# Patient Record
Sex: Female | Born: 1955 | Race: White | Hispanic: No | Marital: Married | Smoking: Never smoker
Health system: Southern US, Community
[De-identification: ages and names within clinical notes are randomized; demographics above are authoritative.]

## PROBLEM LIST (undated history)

## (undated) DIAGNOSIS — J45909 Unspecified asthma, uncomplicated: Secondary | ICD-10-CM

## (undated) DIAGNOSIS — T7840XA Allergy, unspecified, initial encounter: Secondary | ICD-10-CM

## (undated) HISTORY — PX: TONSILLECTOMY: SUR1361

## (undated) HISTORY — DX: Unspecified asthma, uncomplicated: J45.909

## (undated) HISTORY — DX: Allergy, unspecified, initial encounter: T78.40XA

---

## 1998-05-16 ENCOUNTER — Encounter: Admission: RE | Admit: 1998-05-16 | Discharge: 1998-05-16 | Payer: Self-pay | Admitting: Family Medicine

## 1998-05-20 ENCOUNTER — Encounter: Admission: RE | Admit: 1998-05-20 | Discharge: 1998-05-20 | Payer: Self-pay | Admitting: Sports Medicine

## 1998-08-09 ENCOUNTER — Ambulatory Visit (HOSPITAL_COMMUNITY): Admission: RE | Admit: 1998-08-09 | Discharge: 1998-08-09 | Payer: Self-pay | Admitting: Obstetrics and Gynecology

## 1998-08-19 ENCOUNTER — Encounter: Admission: RE | Admit: 1998-08-19 | Discharge: 1998-08-19 | Payer: Self-pay | Admitting: Sports Medicine

## 1998-08-23 ENCOUNTER — Encounter: Admission: RE | Admit: 1998-08-23 | Discharge: 1998-08-23 | Payer: Self-pay | Admitting: Sports Medicine

## 1999-04-20 ENCOUNTER — Encounter: Admission: RE | Admit: 1999-04-20 | Discharge: 1999-04-20 | Payer: Self-pay | Admitting: Sports Medicine

## 1999-09-19 ENCOUNTER — Other Ambulatory Visit: Admission: RE | Admit: 1999-09-19 | Discharge: 1999-09-19 | Payer: Self-pay | Admitting: Obstetrics and Gynecology

## 1999-11-07 ENCOUNTER — Encounter: Payer: Self-pay | Admitting: Obstetrics and Gynecology

## 1999-11-07 ENCOUNTER — Ambulatory Visit (HOSPITAL_COMMUNITY): Admission: RE | Admit: 1999-11-07 | Discharge: 1999-11-07 | Payer: Self-pay | Admitting: Obstetrics and Gynecology

## 2000-05-17 ENCOUNTER — Encounter: Admission: RE | Admit: 2000-05-17 | Discharge: 2000-05-17 | Payer: Self-pay | Admitting: Family Medicine

## 2000-09-27 ENCOUNTER — Other Ambulatory Visit: Admission: RE | Admit: 2000-09-27 | Discharge: 2000-09-27 | Payer: Self-pay | Admitting: Obstetrics and Gynecology

## 2000-10-16 ENCOUNTER — Ambulatory Visit (HOSPITAL_COMMUNITY): Admission: RE | Admit: 2000-10-16 | Discharge: 2000-10-16 | Payer: Self-pay | Admitting: Obstetrics and Gynecology

## 2000-10-16 ENCOUNTER — Encounter: Payer: Self-pay | Admitting: Obstetrics and Gynecology

## 2000-10-22 ENCOUNTER — Encounter: Payer: Self-pay | Admitting: Obstetrics and Gynecology

## 2000-10-22 ENCOUNTER — Encounter: Admission: RE | Admit: 2000-10-22 | Discharge: 2000-10-22 | Payer: Self-pay | Admitting: Obstetrics and Gynecology

## 2000-10-31 ENCOUNTER — Encounter: Admission: RE | Admit: 2000-10-31 | Discharge: 2000-10-31 | Payer: Self-pay | Admitting: Family Medicine

## 2000-11-04 ENCOUNTER — Encounter: Admission: RE | Admit: 2000-11-04 | Discharge: 2000-11-04 | Payer: Self-pay | Admitting: Family Medicine

## 2000-11-11 ENCOUNTER — Encounter: Admission: RE | Admit: 2000-11-11 | Discharge: 2000-11-11 | Payer: Self-pay | Admitting: Sports Medicine

## 2001-11-07 ENCOUNTER — Encounter: Payer: Self-pay | Admitting: Obstetrics and Gynecology

## 2001-11-07 ENCOUNTER — Ambulatory Visit (HOSPITAL_COMMUNITY): Admission: RE | Admit: 2001-11-07 | Discharge: 2001-11-07 | Payer: Self-pay | Admitting: Obstetrics and Gynecology

## 2002-03-17 ENCOUNTER — Other Ambulatory Visit: Admission: RE | Admit: 2002-03-17 | Discharge: 2002-03-17 | Payer: Self-pay | Admitting: Obstetrics and Gynecology

## 2002-05-07 ENCOUNTER — Encounter: Admission: RE | Admit: 2002-05-07 | Discharge: 2002-05-07 | Payer: Self-pay | Admitting: Sports Medicine

## 2002-11-13 ENCOUNTER — Ambulatory Visit (HOSPITAL_COMMUNITY): Admission: RE | Admit: 2002-11-13 | Discharge: 2002-11-13 | Payer: Self-pay | Admitting: Obstetrics and Gynecology

## 2002-11-13 ENCOUNTER — Encounter: Payer: Self-pay | Admitting: Obstetrics and Gynecology

## 2003-05-24 ENCOUNTER — Other Ambulatory Visit: Admission: RE | Admit: 2003-05-24 | Discharge: 2003-05-24 | Payer: Self-pay | Admitting: Obstetrics and Gynecology

## 2003-05-27 ENCOUNTER — Encounter: Admission: RE | Admit: 2003-05-27 | Discharge: 2003-05-27 | Payer: Self-pay | Admitting: Sports Medicine

## 2004-02-21 ENCOUNTER — Encounter: Admission: RE | Admit: 2004-02-21 | Discharge: 2004-02-21 | Payer: Self-pay | Admitting: Family Medicine

## 2004-04-14 ENCOUNTER — Ambulatory Visit (HOSPITAL_COMMUNITY): Admission: RE | Admit: 2004-04-14 | Discharge: 2004-04-14 | Payer: Self-pay | Admitting: Obstetrics and Gynecology

## 2004-05-11 ENCOUNTER — Encounter: Admission: RE | Admit: 2004-05-11 | Discharge: 2004-05-11 | Payer: Self-pay | Admitting: Sports Medicine

## 2005-03-14 ENCOUNTER — Ambulatory Visit: Payer: Self-pay | Admitting: Family Medicine

## 2005-05-03 ENCOUNTER — Ambulatory Visit (HOSPITAL_COMMUNITY): Admission: RE | Admit: 2005-05-03 | Discharge: 2005-05-03 | Payer: Self-pay | Admitting: Obstetrics and Gynecology

## 2005-06-28 ENCOUNTER — Ambulatory Visit: Payer: Self-pay | Admitting: Sports Medicine

## 2006-05-23 ENCOUNTER — Ambulatory Visit (HOSPITAL_COMMUNITY): Admission: RE | Admit: 2006-05-23 | Discharge: 2006-05-23 | Payer: Self-pay | Admitting: Obstetrics and Gynecology

## 2006-11-28 ENCOUNTER — Ambulatory Visit: Payer: Self-pay | Admitting: Sports Medicine

## 2007-02-27 DIAGNOSIS — J45909 Unspecified asthma, uncomplicated: Secondary | ICD-10-CM | POA: Insufficient documentation

## 2007-02-27 DIAGNOSIS — K589 Irritable bowel syndrome without diarrhea: Secondary | ICD-10-CM | POA: Insufficient documentation

## 2007-10-29 ENCOUNTER — Ambulatory Visit: Payer: Self-pay | Admitting: Family Medicine

## 2008-07-22 ENCOUNTER — Ambulatory Visit (HOSPITAL_COMMUNITY): Admission: RE | Admit: 2008-07-22 | Discharge: 2008-07-22 | Payer: Self-pay | Admitting: Obstetrics and Gynecology

## 2008-08-04 ENCOUNTER — Encounter: Admission: RE | Admit: 2008-08-04 | Discharge: 2008-08-04 | Payer: Self-pay | Admitting: Obstetrics and Gynecology

## 2009-01-31 ENCOUNTER — Encounter: Admission: RE | Admit: 2009-01-31 | Discharge: 2009-01-31 | Payer: Self-pay | Admitting: Obstetrics and Gynecology

## 2009-02-04 ENCOUNTER — Encounter (INDEPENDENT_AMBULATORY_CARE_PROVIDER_SITE_OTHER): Payer: Self-pay | Admitting: Obstetrics and Gynecology

## 2009-02-04 ENCOUNTER — Encounter: Admission: RE | Admit: 2009-02-04 | Discharge: 2009-02-04 | Payer: Self-pay | Admitting: Obstetrics and Gynecology

## 2009-07-08 ENCOUNTER — Emergency Department (HOSPITAL_COMMUNITY): Admission: EM | Admit: 2009-07-08 | Discharge: 2009-07-08 | Payer: Self-pay | Admitting: Emergency Medicine

## 2009-11-15 ENCOUNTER — Encounter: Payer: Self-pay | Admitting: Sports Medicine

## 2009-11-15 ENCOUNTER — Ambulatory Visit: Payer: Self-pay | Admitting: Sports Medicine

## 2009-11-15 LAB — CONVERTED CEMR LAB
AST: 17 units/L (ref 0–37)
Albumin: 4.3 g/dL (ref 3.5–5.2)
Calcium: 8.7 mg/dL (ref 8.4–10.5)
HCT: 44.8 % (ref 36.0–46.0)
HDL: 62 mg/dL (ref >39)
Hemoglobin: 14.5 g/dL (ref 12.0–15.0)
LDL Cholesterol: 63 mg/dL (ref 0–99)
RBC: 4.56 M/uL (ref 3.87–5.11)
RDW: 13 % (ref 11.5–15.5)
Total Bilirubin: 1.1 mg/dL (ref 0.3–1.2)
Total Protein: 6.6 g/dL (ref 6.0–8.3)
Triglycerides: 67 mg/dL (ref <150)

## 2009-11-16 ENCOUNTER — Encounter (INDEPENDENT_AMBULATORY_CARE_PROVIDER_SITE_OTHER): Payer: Self-pay | Admitting: *Deleted

## 2009-11-16 ENCOUNTER — Ambulatory Visit: Payer: Self-pay | Admitting: Sports Medicine

## 2009-11-16 DIAGNOSIS — M79609 Pain in unspecified limb: Secondary | ICD-10-CM | POA: Insufficient documentation

## 2010-02-22 ENCOUNTER — Encounter: Admission: RE | Admit: 2010-02-22 | Discharge: 2010-02-22 | Payer: Self-pay | Admitting: Obstetrics and Gynecology

## 2010-11-09 ENCOUNTER — Encounter (INDEPENDENT_AMBULATORY_CARE_PROVIDER_SITE_OTHER): Payer: Self-pay | Admitting: *Deleted

## 2011-01-22 ENCOUNTER — Encounter: Payer: Self-pay | Admitting: Obstetrics and Gynecology

## 2011-01-30 NOTE — Miscellaneous (Signed)
Summary: Ventolin refill   Clinical Lists Changes  Medications: Rx of VENTOLIN HFA 108 (90 BASE) MCG/ACT AERS (ALBUTEROL SULFATE) take ii puffs q4hrs;  #1 x 0;  Signed;  Entered by: Rochele Pages RN;  Authorized by: Enid Baas MD;  Method used: Electronically to [facility] [address] [place], [place]  [id number], Ph: [phone], Fax: [phone]    Prescriptions: VENTOLIN HFA 108 (90 BASE) MCG/ACT AERS (ALBUTEROL SULFATE) take ii puffs q4hrs  #1 x 0   Entered by:   Rochele Pages RN   Authorized by:   Enid Baas MD   Signed by:   Rochele Pages RN on 11/09/2010   Method used:   Electronically to        [facility] (retail)       [address]       [place], [place]  [id number]       Ph: [phone]       Fax: [phone]   RxID:   [id number]  Called patient, advised to make sure she schedules appt within the next month because it has been almost 1 year since she has been seen. Rochele Pages RN  November 09, 2010 12:34 PM

## 2011-02-23 ENCOUNTER — Other Ambulatory Visit (HOSPITAL_COMMUNITY): Payer: Self-pay | Admitting: Obstetrics and Gynecology

## 2011-02-23 DIAGNOSIS — Z1231 Encounter for screening mammogram for malignant neoplasm of breast: Secondary | ICD-10-CM

## 2011-03-05 ENCOUNTER — Ambulatory Visit (HOSPITAL_COMMUNITY)
Admission: RE | Admit: 2011-03-05 | Discharge: 2011-03-05 | Disposition: A | Discharge status: Home / Self Care | Payer: 59 | Source: Ambulatory Visit | Attending: Obstetrics and Gynecology | Admitting: Obstetrics and Gynecology

## 2011-03-05 ENCOUNTER — Ambulatory Visit (INDEPENDENT_AMBULATORY_CARE_PROVIDER_SITE_OTHER): Payer: 59 | Admitting: Sports Medicine

## 2011-03-05 ENCOUNTER — Encounter: Payer: Self-pay | Admitting: Sports Medicine

## 2011-03-05 DIAGNOSIS — M25559 Pain in unspecified hip: Secondary | ICD-10-CM

## 2011-03-05 DIAGNOSIS — M6281 Muscle weakness (generalized): Secondary | ICD-10-CM | POA: Insufficient documentation

## 2011-03-05 DIAGNOSIS — Z1231 Encounter for screening mammogram for malignant neoplasm of breast: Secondary | ICD-10-CM

## 2011-03-13 NOTE — Assessment & Plan Note (Signed)
Summary: LEFT HIP [facility] [phone]   Vital Signs:  Patient profile:   56 year old female Height:      66 inches Weight:      130 pounds BMI:     21.06 Pulse rate:   62 / minute BP sitting:   115 / 78  (right arm)  Vitals Entered By: Rochele Pages RN (March 05, 2011 10:05 AM) CC: Left low back pain, L side ant hip pain   CC:  Left low back pain and L side ant hip pain.  History of Present Illness: [Note by: Kerman Passey MSIV]  Katelyn Thomas presents today with a CC of L hip pain and L lower back pain.  Patient states that her hip has been problematic for about 3 weeks.  Previously, her hip only gave her pain/discomfort after steep hikes. However over the past 3 weeks her routine workout has caused aching so severe that she has lifts her left leg with her hands to abduct her hip after her exercise.  She is currently not taking any medication to help with the pain.  She states that her left hip feels extremely tight and so stretching has helped provide some relief. Patient denies point tenderness in the region of her greater trochanter.  She denies any previous trauma to the L hip.  However she states that her left hip has always felt tighter than her right hip. The aching has caused her to wake up from sleep at night.  Stretching helps alleviate her discomfort.  Preventive Screening-Counseling & Management  Alcohol-Tobacco     Smoking Status: never  Problems Prior to Update: 1)  Muscle Weakness (GENERALIZED)  (ICD-728.87) 2)  Foot Pain  (ICD-729.5) 3)  Foot Pain, Right  (ICD-729.5) 4)  Preventive Health Care  (ICD-V70.0) 5)  Health Screening  (ICD-V70.0) 6)  Irritable Bowel Syndrome  (ICD-564.1) 7)  Asthma, Unspecified  (ICD-493.90)  Medications Prior to Update: 1)  Ventolin Hfa 108 (90 Base) Mcg/act Aers (Albuterol Sulfate) .... 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 2 sisters 58and 68 both A&W  father 37 A&W,   mother DJD at age 50 now has AODM although she is slim some bipolar issues in late 60s/ slight memory loss  Social History: Reviewed history from 11/15/2009 and no changes required. 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         per HPI  Physical Exam  General:  alert and healthy-appearing.   Msk:  Hip: ROM IR full and only slt  decreased ER  of (L) hip,  Abduction: 45 Deg (B)  No TTP at great troch/  slt ttp just behind this at tend insertion of glut med  Strength Flexion: 5/5 (B), Extension: 5/5 (B), Abduction: 5/5 (R) 4/5 (L) Pelvic alignment unremarkable to inspection and palpation. No tenderness to palpation of greater trochanter (L) SI joint tenderness & SI joint mouse Hip rock negative to pain, decreased motion of the left hip. FABER caused no discomfort. However, left side showed marked decrease in flexibility.    Hip Exam  Hip Exam:    Right:    Inspection:  Normal    Palpation:  Normal    Stability:  stable    Left:    Inspection:  Normal    Palpation:  Normal    Stability:  stable     Tenderness:  no    Swelling:  no    Impression & Recommendations:  Problem # 1:  HIP PAIN, LEFT (ICD-719.45) Gluteus medius weakness - patient was instructed to start strengthening exercises for her gluteus medius. - patient advised she may continue her work out on the stair cljmber, but should keep the resistance low. - after 6 weeks patient should retrun for f/u appt. If no improvment, may perform [place] scan of (L) hip and try more aggressive treatment methods.  Complete Medication List: 1)  Ventolin Hfa 108 (90 Base) Mcg/act Aers (Albuterol sulfate) .... Take ii puffs q4hrs  Patient Instructions: 1)  Instructed to start home exercise program to help strengthen her gluteus medius. Directions included the following exercises/stretches: 2)  pretzel stretch 3)  cross over step up 4)  hip abduction 5)  standing hip rotation 6)  Advised that to continue her workout routine on the stair climber but should keep her exercise at a low resistance. 7)  Instructed to follow-up after 6 weeks. If no change noted at that time, may consider scanning gluteus medius and more aggressive means of therapy for treatment. Prescriptions: VENTOLIN HFA 108 (90 BASE) MCG/ACT AERS (ALBUTEROL SULFATE) take ii puffs q4hrs  #1 x 12   Entered by:   Lillia Pauls CMA   Authorized by:   Enid Baas MD   Signed by:   Lillia Pauls CMA on 03/05/2011   Method used:   Electronically to        [facility] (retail)       [address]       [place], [place]  [id number]       Ph: [phone]       Fax: [phone]   RxID:   [id number]    Orders Added: 1)  Est. Patient Level III [[address]75]

## 2011-04-27 ENCOUNTER — Encounter: Payer: Self-pay | Admitting: Sports Medicine

## 2011-04-27 ENCOUNTER — Ambulatory Visit (INDEPENDENT_AMBULATORY_CARE_PROVIDER_SITE_OTHER): Payer: 59 | Admitting: Sports Medicine

## 2011-04-27 VITALS — BP 109/70 | HR 62

## 2011-04-27 DIAGNOSIS — M25559 Pain in unspecified hip: Secondary | ICD-10-CM

## 2011-04-27 DIAGNOSIS — M533 Sacrococcygeal disorders, not elsewhere classified: Secondary | ICD-10-CM | POA: Insufficient documentation

## 2011-04-27 DIAGNOSIS — M999 Biomechanical lesion, unspecified: Secondary | ICD-10-CM

## 2011-04-27 NOTE — Progress Notes (Deleted)
  Subjective:    Patient ID: Katelyn Thomas, female    DOB: [birth year], 56 y.o.   MRN: [id number]  HPI    Review of Systems     Objective:   Physical Exam        Assessment & Plan:

## 2011-04-27 NOTE — Assessment & Plan Note (Signed)
I suggested that she see the physical therapist for evaluation and exercises to help [place] in the pain over both SI joints.  Referred to Sharen Hones  I will follow up with her after she has had at least 8 sessions.

## 2011-04-27 NOTE — Progress Notes (Signed)
  Subjective:    Patient ID: Katelyn Thomas, female    DOB: November 23, 1955, 56 y.o.   MRN: [id number]  HPI  Pt presents to clinic for f/u of L hip pain and lower back pain.   L hip pain is much improved.  Discomfort is infrequent and less intense.  Able to sleep on lt side now.   Did suggested hip exercises 5 days per week. Lt low back pain continues- cycling aggravates pain the most- and aching will remain for the next 1-2 days.  Does not take any medication for pain.  Has decreased mileage and speed due to pain.  Cycles about 3 times per week. Has incorporated some back stretches and strengthening work 5 times per week.     Review of Systems     Objective:   Physical Exam     Tenderness over SI joint nodule on lt IR 25 deg, ER 45 deg on rt IR 25 deg, ER 35 on lt  FABER limited on lt Sartorius strong bilat Quads strong bilat Good hip flexion strength bilat straight position and externally rotated  SI joints move Good hip abduction and ER strength bilat Hip extension good bilat Good HS strength bilat Nodules on rt and lt SI joints, Lt is more prominent when lying on stomach, but more posterior when lying on back  MSK ultrasound Skin does reveal a soft tissue nodule just about the left PSIS that is freely mobile. This is 1.5 x 1.4 x 1 cm in greatest dimensions. There is no tendinous tear nor any abnormal Doppler flow is noted. Just above the right PSIS there is also a small nodule that is 1.2 x 0.8 x 0.4 cm in greatest dimensions. There is no tear of the tendons nor is there any abnormal Doppler flow.       Assessment & Plan:

## 2011-04-27 NOTE — Assessment & Plan Note (Signed)
This is much improved after completing her exercise program and does not seem to be limiting her from exercise with most of her symptoms now referring to the low back.

## 2011-05-15 ENCOUNTER — Ambulatory Visit (INDEPENDENT_AMBULATORY_CARE_PROVIDER_SITE_OTHER): Payer: 59 | Admitting: Sports Medicine

## 2011-05-15 ENCOUNTER — Encounter: Payer: Self-pay | Admitting: Sports Medicine

## 2011-05-15 VITALS — BP 101/67

## 2011-05-15 DIAGNOSIS — M533 Sacrococcygeal disorders, not elsewhere classified: Secondary | ICD-10-CM

## 2011-05-15 DIAGNOSIS — M999 Biomechanical lesion, unspecified: Secondary | ICD-10-CM

## 2011-05-15 NOTE — Progress Notes (Signed)
  Subjective:    Patient ID: Katelyn Thomas, female    DOB: 26-Aug-1955, 56 y.o.   MRN: [id number]  HPI  Pt continues with pain over left SI joint, considering injection today.  The hip pain localizes directly to the SI joint. She gets some brief relief with soft tissue work. She gets worsening with too much biking. She does plan to go to physical therapy but would like to try the injection to see if she can lessen her pain prior to starting the therapy.   Review of Systems     Objective:   Physical Exam     Lt SI joint anteriorly rotated, does not move as freely as right FABER normal on rt, tight on lt Large SI joint nodule on left approx 1 cm, smaller nodule on rt Left SI joint moves but is somewhat limited with external rotation of the left hip. Hip joint shows normal range of motion and she has good strength of the muscles around the hip girdle.    Assessment & Plan:

## 2011-05-15 NOTE — Assessment & Plan Note (Signed)
Consent obtained and verified. Sterile prep- cleansed with alcohol. Topical analgesic spray: Ethyl chloride. Joint: Approached in typical fashion with: lateral rotation of left hip the injection was directed into superior aspect of SI joint on left Completed without difficulty Meds: kenalog 40 + 4 cc of lidocaine 1% Needle:25 G and 1.5 inch Aftercare instructions and Red flags advised.  After injection she should keep up some easy rotation exercises for the hip and SI joint. She should use icing for pain relief. Once she is done about 6 weeks of physical therapy I would like to reevaluate her.

## 2011-05-17 ENCOUNTER — Ambulatory Visit: Payer: 59 | Admitting: Sports Medicine

## 2012-03-26 ENCOUNTER — Other Ambulatory Visit: Payer: Self-pay | Admitting: *Deleted

## 2012-03-26 MED ORDER — ALBUTEROL SULFATE HFA 108 (90 BASE) MCG/ACT IN AERS: 2.0000 | INHALATION_SPRAY | RESPIRATORY_TRACT | Status: DC | PRN

## 2012-04-07 ENCOUNTER — Other Ambulatory Visit (HOSPITAL_COMMUNITY): Payer: Self-pay | Admitting: Obstetrics and Gynecology

## 2012-04-07 DIAGNOSIS — Z1231 Encounter for screening mammogram for malignant neoplasm of breast: Secondary | ICD-10-CM

## 2012-05-06 ENCOUNTER — Ambulatory Visit (HOSPITAL_COMMUNITY)
Admission: RE | Admit: 2012-05-06 | Discharge: 2012-05-06 | Disposition: A | Discharge status: Home / Self Care | Payer: 59 | Source: Ambulatory Visit | Attending: Obstetrics and Gynecology | Admitting: Obstetrics and Gynecology

## 2012-05-06 DIAGNOSIS — Z1231 Encounter for screening mammogram for malignant neoplasm of breast: Secondary | ICD-10-CM | POA: Insufficient documentation

## 2012-12-29 ENCOUNTER — Ambulatory Visit (INDEPENDENT_AMBULATORY_CARE_PROVIDER_SITE_OTHER): Payer: 59 | Admitting: Family Medicine

## 2012-12-29 VITALS — BP 109/71 | HR 67 | Temp 98.3°F | Resp 16 | Ht 66.0 in | Wt 127.0 lb

## 2012-12-29 DIAGNOSIS — H6690 Otitis media, unspecified, unspecified ear: Secondary | ICD-10-CM

## 2012-12-29 DIAGNOSIS — J329 Chronic sinusitis, unspecified: Secondary | ICD-10-CM

## 2012-12-29 MED ORDER — FLUTICASONE PROPIONATE 50 MCG/ACT NA SUSP: 2.0000 | Freq: Every day | NASAL | Status: DC

## 2012-12-29 MED ORDER — TRAMADOL HCL 50 MG PO TABS: 50.0000 mg | ORAL_TABLET | Freq: Three times a day (TID) | ORAL | Status: DC | PRN

## 2012-12-29 MED ORDER — CEFDINIR 300 MG PO CAPS: 300.0000 mg | ORAL_CAPSULE | Freq: Two times a day (BID) | ORAL | Status: DC

## 2012-12-29 NOTE — Progress Notes (Signed)
Subjective: Patient has had an upper sparked or infection for several days. Thinks her sinuses are infected. The last few days she's had increasing pain in her right ear. She has a history of a lot of otitis infections when she was young.  Objective:  Right ear red and bulging. Left TM normal. Throat mildly erythematous more on the right. Neck neck supple without significant nodes. She's mildly tender over sinuses. Chest is clear to auscultation.  Assessment: Right otitis media Sinusitis  Plan: Flonase Omnicef 300 twice a day (has taken cephalosporin successfully ) Comment off for pain  Return if worse.

## 2012-12-29 NOTE — Patient Instructions (Signed)
Take medications as directed. Return if worse.

## 2013-06-24 ENCOUNTER — Other Ambulatory Visit (HOSPITAL_COMMUNITY): Payer: Self-pay | Admitting: Obstetrics and Gynecology

## 2013-06-24 DIAGNOSIS — Z1231 Encounter for screening mammogram for malignant neoplasm of breast: Secondary | ICD-10-CM

## 2013-07-01 ENCOUNTER — Ambulatory Visit (HOSPITAL_COMMUNITY)
Admission: RE | Admit: 2013-07-01 | Discharge: 2013-07-01 | Disposition: A | Discharge status: Home / Self Care | Payer: 59 | Source: Ambulatory Visit | Attending: Obstetrics and Gynecology | Admitting: Obstetrics and Gynecology

## 2013-07-01 DIAGNOSIS — Z1231 Encounter for screening mammogram for malignant neoplasm of breast: Secondary | ICD-10-CM | POA: Insufficient documentation

## 2014-01-28 ENCOUNTER — Ambulatory Visit (INDEPENDENT_AMBULATORY_CARE_PROVIDER_SITE_OTHER): Payer: 59 | Admitting: Emergency Medicine

## 2014-01-28 ENCOUNTER — Encounter: Payer: Self-pay | Admitting: Emergency Medicine

## 2014-01-28 VITALS — BP 112/75 | HR 57 | Ht 66.0 in | Wt 130.0 lb

## 2014-01-28 DIAGNOSIS — M6289 Other specified disorders of muscle: Secondary | ICD-10-CM | POA: Insufficient documentation

## 2014-01-28 DIAGNOSIS — M6580 Other synovitis and tenosynovitis, unspecified site: Secondary | ICD-10-CM

## 2014-01-28 MED ORDER — ALBUTEROL SULFATE HFA 108 (90 BASE) MCG/ACT IN AERS: 2.0000 | INHALATION_SPRAY | Freq: Four times a day (QID) | RESPIRATORY_TRACT | Status: DC | PRN

## 2014-01-28 NOTE — Progress Notes (Signed)
Patient ID: Katelyn Thomas, female   DOB: Oct 06, 1955, 59 y.o.   MRN: [id number] 59 year old female with left anterior lateral hip pain. Onset when hiking up steep hills. Pain also has been noted at night. He said a little bit difficult to sleep on her left side. She's been seeing [name] for physical therapy. A history of hip stabilizer weakness which is treated with strengthening exercises in the past and this improve symptoms. This pain started at limit her activities recently however. Onset of pain was approximately one month ago.  Pertinent past medical history: Hip stabilizer weakness, SI joint dysfunction  Social history nonsmoker  Review of systems as per history of present illness otherwise negative  Examination: BP 112/75  Pulse 57  Ht 5\' 6"  (1.676 m)  Wt 130 lb (58.968 kg)  BMI 20.99 kg/m2 Well-developed well-nourished 59 year old white female awake alert and oriented no acute distress  Hip: ROM IR: 80 Deg, ER: 80 Deg, Flexion: 120 Deg, Extension: 100 Deg, Abduction: 45 Deg, Adduction: 45 Deg Strength IR: 5/5, ER: 5/5, Flexion: 5/5, Extension: 5/5, Abduction: 5/5, Adduction: 5/5 Pelvic alignment unremarkable to inspection and palpation. Standing hip rotation and gait without trendelenburg / unsteadiness. Greater trochanter without tenderness to palpation. No tenderness over piriformis and greater trochanter. No SI joint tenderness and normal minimal SI movement. TTP noted over anterior portion of tensor fascia lata.  Muscle skeletal ultrasound of the left lateral hip was performed. Images obtained were consistent with a small amount of fluid the origin of the anterior portion of the tensor fascia lata muscle. Otherwise unremarkable ultrasound

## 2014-01-28 NOTE — Patient Instructions (Signed)
Your ultrasound today showed evidence of fluid at the origin of the Tensor Fascia Lata muscle today.  This is consistent with irritation within that muscle.  You should let your therapist know this and he can work with you on that muscle specifically.  If you do not start to notice any improvement in the next two weeks call us and we will prescribe topical nitroglycerin at that time.  Follow up in 4-6 weeks.

## 2014-01-28 NOTE — Addendum Note (Signed)
Addended by: Arnette Schaumann C on: 01/28/2014 10:54 AM   Modules accepted: Orders

## 2014-01-28 NOTE — Assessment & Plan Note (Signed)
Continue physical therapy with [facility].  If symptoms do not start to improve next 2 weeks' time she will call and we will prescribe topical nitroglycerin. She'll followup with Dr. Oneida Alar in one.

## 2014-03-10 ENCOUNTER — Ambulatory Visit (INDEPENDENT_AMBULATORY_CARE_PROVIDER_SITE_OTHER): Payer: 59 | Admitting: Sports Medicine

## 2014-03-10 ENCOUNTER — Encounter: Payer: Self-pay | Admitting: Sports Medicine

## 2014-03-10 ENCOUNTER — Ambulatory Visit
Admission: RE | Admit: 2014-03-10 | Discharge: 2014-03-10 | Disposition: A | Discharge status: Home / Self Care | Payer: 59 | Source: Ambulatory Visit | Attending: Family Medicine | Admitting: Family Medicine

## 2014-03-10 VITALS — BP 106/71 | Ht 66.0 in | Wt 130.0 lb

## 2014-03-10 DIAGNOSIS — M25552 Pain in left hip: Secondary | ICD-10-CM

## 2014-03-10 DIAGNOSIS — M25559 Pain in unspecified hip: Secondary | ICD-10-CM

## 2014-03-10 NOTE — Progress Notes (Signed)
CC: Followup left hip pain HPI: Patient is a very pleasant 59 year old female who presents for followup of left hip pain. This has been going on now for a couple years unfortunately has not been getting better. She has done extensive physical therapy for pelvic and hip strengthening with [name] but unfortunately has not noticed improvement despite improvement in her strength and pelvic stability. She is also been working with her husband on [place]. She has had to modify her activity and no longer can do exercises that require loading the hip joint. She cannot sleep on the left side and she is a side sleeper. She notes that now both sides are starting to bother her. Her pain is exacerbated by walking up a steep hill, doing a stair machine, or anything that looks with hip. She is frustrated as she can no longer do activities that she really enjoys.  ROS: As above in the HPI. All other systems are stable or negative.  OBJECTIVE: APPEARANCE:  Patient in no acute distress.The patient appeared well nourished and normally developed. HEENT: No scleral icterus. Conjunctiva non-injected Resp: Non labored Skin: No rash MSK:  Left Hip exam:  - No swelling or deformity - Full range of motion in flexion, extension, internal and external rotation without pain - Pain with FADIR with deep flexion and IR - Negative logroll - Tenderness to palpation over posterior greater trochanter near the attachment of the gluteus maximus - Strength is 5 out of 5 on hip flexion, abduction, flexion and abduction. Strength 4/5 in the left gluteus maximus. Normal strength of the right gluteus maximus. - Neurovascularly intact - FABERs negative   MSK [place]: Not performed   ASSESSMENT: #1. Chronic left hip pain with concern for pathology within the hip joint as well as noted weakness and suspected inhibition of firing of the left gluteus maximus.   PLAN: Will obtain an x-ray of her left hip. Suspect that she may  need an MRI for further investigation. We will work on correcting her left gluteus maximus weakness with reverse straight leg raise and standing leg extension. Further treatment and followup pending imaging results.

## 2014-03-10 NOTE — Patient Instructions (Signed)
Thank you for coming in today  Get xrays of your hip We may follow this up with MRI  Start laying leg extension on your stomach 3 x 15 Standing hip extension with ankle weight 3 x 15  Stick with machines that do not cause pain

## 2014-03-24 ENCOUNTER — Other Ambulatory Visit: Payer: Self-pay | Admitting: *Deleted

## 2014-03-24 DIAGNOSIS — M25552 Pain in left hip: Secondary | ICD-10-CM

## 2014-03-26 ENCOUNTER — Encounter: Payer: Self-pay | Admitting: *Deleted

## 2014-03-26 DIAGNOSIS — M25552 Pain in left hip: Secondary | ICD-10-CM

## 2014-03-29 ENCOUNTER — Inpatient Hospital Stay: Admission: RE | Admit: 2014-03-29 | Payer: 59 | Source: Ambulatory Visit

## 2014-03-31 ENCOUNTER — Other Ambulatory Visit: Payer: 59

## 2014-04-05 ENCOUNTER — Ambulatory Visit
Admission: RE | Admit: 2014-04-05 | Discharge: 2014-04-05 | Disposition: A | Discharge status: Home / Self Care | Payer: 59 | Source: Ambulatory Visit | Attending: Sports Medicine | Admitting: Sports Medicine

## 2014-04-05 DIAGNOSIS — M25552 Pain in left hip: Secondary | ICD-10-CM

## 2014-04-05 MED ORDER — IOHEXOL 180 MG/ML  SOLN
12.0000 mL | Freq: Once | INTRAMUSCULAR | Status: AC | PRN
  Administered 2014-04-05: 12 mL via INTRA_ARTICULAR

## 2014-04-07 ENCOUNTER — Other Ambulatory Visit: Payer: Self-pay | Admitting: Sports Medicine

## 2014-04-07 ENCOUNTER — Other Ambulatory Visit: Payer: Self-pay | Admitting: *Deleted

## 2014-04-07 DIAGNOSIS — M25559 Pain in unspecified hip: Secondary | ICD-10-CM

## 2014-04-07 DIAGNOSIS — M25552 Pain in left hip: Secondary | ICD-10-CM

## 2014-04-08 ENCOUNTER — Ambulatory Visit
Admission: RE | Admit: 2014-04-08 | Discharge: 2014-04-08 | Disposition: A | Discharge status: Home / Self Care | Payer: 59 | Source: Ambulatory Visit | Attending: Sports Medicine | Admitting: Sports Medicine

## 2014-04-08 DIAGNOSIS — M25552 Pain in left hip: Secondary | ICD-10-CM

## 2014-04-08 MED ORDER — METHYLPREDNISOLONE ACETATE 40 MG/ML INJ SUSP (RADIOLOG
120.0000 mg | Freq: Once | INTRAMUSCULAR | Status: AC
  Administered 2014-04-08: 120 mg via INTRA_ARTICULAR

## 2014-04-08 MED ORDER — IOHEXOL 180 MG/ML  SOLN
1.0000 mL | Freq: Once | INTRAMUSCULAR | Status: AC | PRN
  Administered 2014-04-08: 1 mL via INTRA_ARTICULAR

## 2014-09-20 ENCOUNTER — Other Ambulatory Visit: Payer: Self-pay | Admitting: Dermatology

## 2015-03-23 ENCOUNTER — Other Ambulatory Visit: Payer: Self-pay | Admitting: *Deleted

## 2015-03-23 ENCOUNTER — Encounter: Payer: Self-pay | Admitting: Sports Medicine

## 2015-03-23 ENCOUNTER — Ambulatory Visit (INDEPENDENT_AMBULATORY_CARE_PROVIDER_SITE_OTHER): Payer: 59 | Admitting: Sports Medicine

## 2015-03-23 VITALS — BP 122/74 | HR 59 | Ht 66.0 in | Wt 135.0 lb

## 2015-03-23 DIAGNOSIS — M25531 Pain in right wrist: Secondary | ICD-10-CM

## 2015-03-23 MED ORDER — DICLOFENAC SODIUM 1 % TD GEL: 2.0000 g | Freq: Four times a day (QID) | TRANSDERMAL | Status: DC

## 2015-03-23 MED ORDER — ALBUTEROL SULFATE HFA 108 (90 BASE) MCG/ACT IN AERS: 2.0000 | INHALATION_SPRAY | Freq: Four times a day (QID) | RESPIRATORY_TRACT | Status: DC | PRN

## 2015-03-24 ENCOUNTER — Ambulatory Visit
Admission: RE | Admit: 2015-03-24 | Discharge: 2015-03-24 | Disposition: A | Discharge status: Home / Self Care | Payer: 59 | Source: Ambulatory Visit | Attending: Sports Medicine | Admitting: Sports Medicine

## 2015-03-24 DIAGNOSIS — M25531 Pain in right wrist: Secondary | ICD-10-CM | POA: Insufficient documentation

## 2015-03-24 NOTE — Progress Notes (Signed)
Katelyn Thomas - 60 y.o. female MRN [id number]  Date of birth: 1955/10/03  SUBJECTIVE: CC: Right wrist pain, initial evaluation  Medication refill, asthma      HPI:   2+ months of right dorsal wrist pain  Pain is worse in the pushup position.  Over the ulnar aspect of the dorsum of the wrist  No known injury  No medications tried, no immobilization tried Exline bothersome with exercise mainly but also during the day and occasionally will awaken her at night  Previously was cycling but no known injury and has not been participating over the past several months  Prior injury to this wrist in the same area several years ago while doing rope whips and yoga   Asthma   Reports exercise-induced asthma symptoms for several years. Has been using an albuterol inhaler prior to exercise especially during season changes and during the cold.  No significant respiratory issues other than during exercise in no prior intubations, significant hospitalizations for respiratory distress. Symptoms abate after cessation of exercise      ROS:   reports occasional numbness along the ulnar nerve distribution of the left wrist but never above the mid forearm.  No significant weakness  No fevers, chills recent weight gain or weight loss    HISTORY:  Past Medical, Surgical, Social, and Family History reviewed & updated per EMR.  Pertinent Historical Findings include:  reports that she has never smoked. She has never used smokeless tobacco. reviewed, exercise-induced asthma Irritable bowel syndrome    OBJECTIVE:  VS:   HT:5\' 6"  (167.6 cm)   WT:135 lb (61.236 kg)  BMI:21.8          BP:122/74 mmHg  HR:(!) 59bpm  TEMP: ( )  RESP:   PHYSICAL EXAM: GENERAL:  adult Caucasian female . No acute distress PSYCH: Alert and appropriately interactive. SKIN: No open skin lesions or abnormal skin markings on areas inspected as below VASCULAR: Right upper extremity radial and ulnar pulses 2+/4. NEURO:   grip strength is normal, sensation is intact to light touch. Tinel's at the median nerve is negative, ulnar nerve is equivocal RIGHT WRIST: Overall normal-appearing no significant deformity. She is tender over the ulnar styloid & junction of the proximal and distal carpal row over the ulnar aspect of the dorsum of the wrist. No significant pain over the palmar aspect of the wrist or hand. No pain over the scaphoid or anatomic snuffbox. Pain with ulnar deviation and supination pronation that localizes over the dorsum of the wrist, not over the ulnar aspect. Negative shuck test  DATA OBTAINED DURING VISIT: LIMITED MSK ULTRASOUND OF RIGHT WRIST: Findings:  Extensor compartments are normal appearing with a small amount of excess fluid in the fourth and fifth compartments. No significant disruption over the scapholunate ligament. There is slight increased vascularity over the piece of form with some surrounding hypoechoic fluid. Negative sonopalpation Impression: Overall unremarkable ultrasound, neovascularity around the pisiform    ASSESSMENT: 1. Right wrist pain    Problem  Right Wrist Pain   Unclear etiology but pain over the dorsal sided, ulnar aspect aspect of the wrist worse with hyperextension. No tenderness directly over TFCC. Initially injured doing rope whips several years ago but has significant only worsened over the past several months. Question ulnar neuropathy Limited MSK ultrasound 03/22/2015 unrevealing X-rays pending       PLAN: See problem based charting & AVS for additional documentation.  Voltaren gel, patient is hesitant to use any systemic treatment  Wrist  brace for comfort and recommend wearing each night as her symptoms do seem to exacerbate in certain positions  X-rays including carpal tunnel view to evaluate pisiform and hook of hamate although no significant palmar pain ultrasound with hypoechoic change at the pisiform.   Refill albuterol > No Follow-up on file.

## 2015-04-27 ENCOUNTER — Encounter: Payer: Self-pay | Admitting: Sports Medicine

## 2015-04-27 ENCOUNTER — Ambulatory Visit (INDEPENDENT_AMBULATORY_CARE_PROVIDER_SITE_OTHER): Payer: 59 | Admitting: Sports Medicine

## 2015-04-27 VITALS — BP 109/71 | Ht 66.0 in | Wt 133.0 lb

## 2015-04-27 DIAGNOSIS — M25531 Pain in right wrist: Secondary | ICD-10-CM

## 2015-04-27 NOTE — Assessment & Plan Note (Signed)
-  Fitted and applied body helix compression sleeve to right wrist -Hand grip and gentle flexion/ext wrist exercises -Continue voltaren -Goal: Strengthen and support to limit instability of pisiform -Plan follow-up if needed

## 2015-04-27 NOTE — Progress Notes (Signed)
   Subjective:    Patient ID: Katelyn Thomas, female    DOB: Mar 13, 1955, 60 y.o.   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 Recall that sometime last year she was working with a Physiological scientist doing rope swings when she felt pain in the right ulnar portion of her wrist. She had tried wearing a brace, but discontinued this after 2 days because it increased her pain. She found relief with using simple Coban and wrist wrap. Unfortunately, she says that her right wrist pain persists. Her pain is intermittent and is aggravated with doing presses of any kind on her wrist such as pushups or yoga. She denies any bruising, swelling, numbness, tingling, or weakness. X-rays were performed at our last visit which did not show any significant bony abnormalities or arthritis. Normal bony alignment. She is using topical Voltaren which provides some relief. 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 She has tried ergonomic optimization strategies. She denies any associated clicking or catching in the wrist.  Past medical history, social history, medications, and allergies were reviewed and are up to date in the chart.  Review of Systems 7 point review of systems was performed and was otherwise negative unless noted in the history of present illness.     Objective:   Physical Exam BP 109/71 mmHg  Ht 5\' 6"  (1.676 m)  Wt 133 lb (60.328 kg)  BMI 21.48 kg/m2 GEN: The patient is well-developed well-nourished female and in no acute distress.  She is awake alert and oriented x3. SKIN: warm and well-perfused, no rash  Neuro: Strength 5/5 globally. Sensation intact throughout. DTRs 2/4 bilaterally. No focal deficits. Vasc: +2 bilateral distal pulses. No edema.  MSK: Examination of the bilateral wrists reveals full range of motion without deficits in flexion, extension, adduction, or  abduction. Mild tenderness to palpation over the right pisiform bone where the FCU inserts. Reproduced pain with resisted wrist flexion over the FCU. No tenderness or clicking at the TFCC. No wrist instability. Negative Watson's test. She is neurovascularly intact distally. Negative median nerve compression test. Negative Finkelstein's.     Assessment & Plan:  Please see problem based assessment and plan in the problem list.

## 2015-09-23 IMAGING — CR DG PELVIS 1-2V
1 series · 1 of 1 positions shown · non-contrast
Comparison: none

[w pelvis]
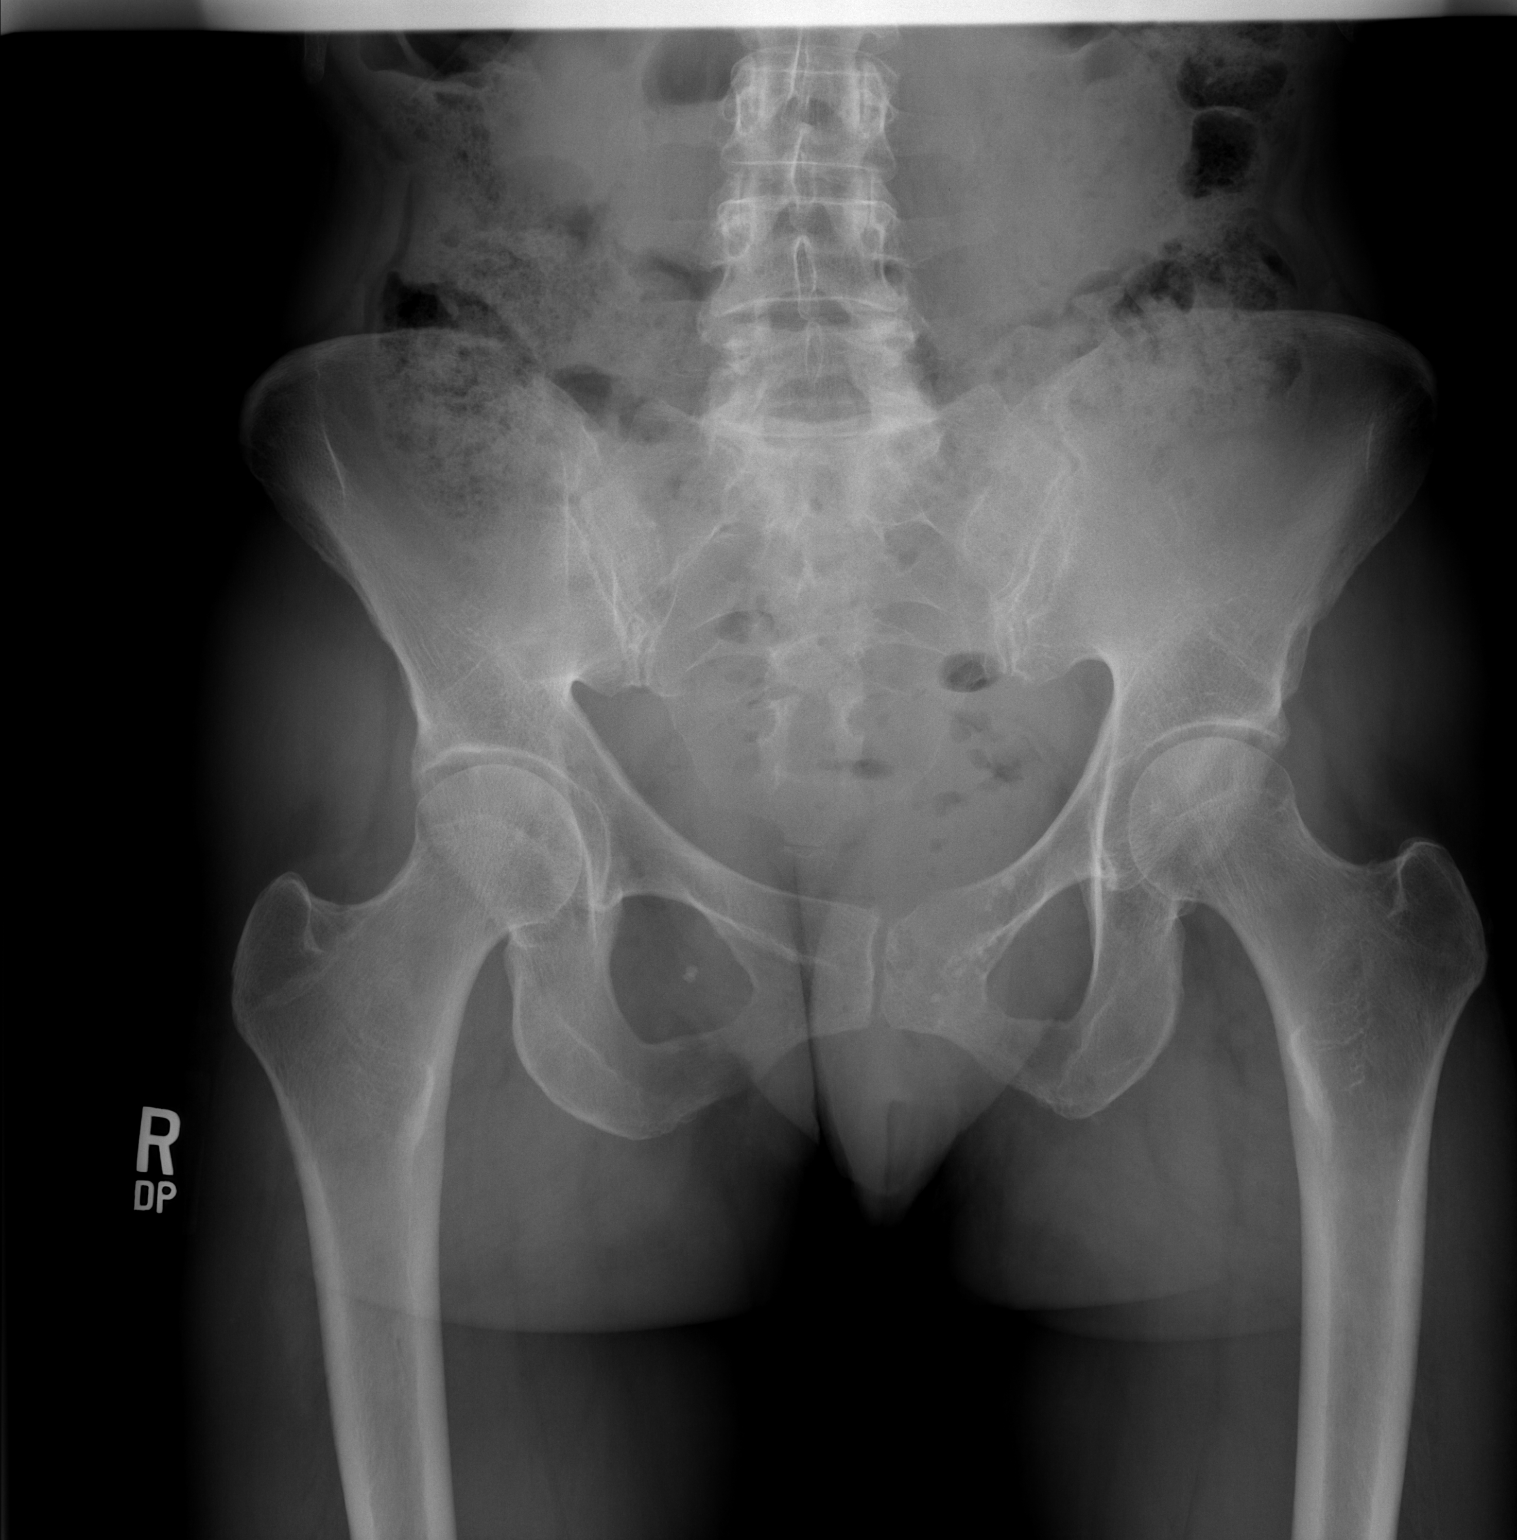

[1 of 1 positions shown; findings below may reference images not displayed]

CLINICAL DATA
Left hip pain

EXAM
PELVIS - 1-2 VIEW

COMPARISON
None.

FINDINGS
No significant degenerative joint disease of the hips is seen. The
pelvic rami are intact. The SI joints appear corticated. There is
some degenerative change noted at the L5-S1 level.

IMPRESSION
No significant degenerative change of the hips. There is some
degenerative change at the L5-S1 level.

SIGNATURE

## 2015-10-30 ENCOUNTER — Ambulatory Visit (INDEPENDENT_AMBULATORY_CARE_PROVIDER_SITE_OTHER): Payer: 59 | Admitting: Physician Assistant

## 2015-10-30 VITALS — BP 102/78 | HR 54 | Temp 98.3°F | Resp 16 | Ht 66.0 in | Wt 136.2 lb

## 2015-10-30 DIAGNOSIS — Z13220 Encounter for screening for lipoid disorders: Secondary | ICD-10-CM

## 2015-10-30 DIAGNOSIS — Z13228 Encounter for screening for other metabolic disorders: Secondary | ICD-10-CM | POA: Diagnosis not present

## 2015-10-30 DIAGNOSIS — Z23 Encounter for immunization: Secondary | ICD-10-CM | POA: Diagnosis not present

## 2015-10-30 DIAGNOSIS — Z130 Encounter for screening for diseases of the blood and blood-forming organs and certain disorders involving the immune mechanism: Secondary | ICD-10-CM

## 2015-10-30 DIAGNOSIS — Z1159 Encounter for screening for other viral diseases: Secondary | ICD-10-CM

## 2015-10-30 DIAGNOSIS — Z1329 Encounter for screening for other suspected endocrine disorder: Secondary | ICD-10-CM

## 2015-10-30 DIAGNOSIS — Z0000 Encounter for general adult medical examination without abnormal findings: Secondary | ICD-10-CM | POA: Diagnosis not present

## 2015-10-30 DIAGNOSIS — Z114 Encounter for screening for human immunodeficiency virus [HIV]: Secondary | ICD-10-CM

## 2015-10-30 LAB — CBC WITH DIFFERENTIAL/PLATELET
Basophils Absolute: 0 10*3/uL (ref 0.0–0.1)
Basophils Relative: 1 % (ref 0–1)
Eosinophils Absolute: 0.1 10*3/uL (ref 0.0–0.7)
Eosinophils Relative: 3 % (ref 0–5)
HEMATOCRIT: 44.4 % (ref 36.0–46.0)
Hemoglobin: 14.5 g/dL (ref 12.0–15.0)
LYMPHS PCT: 26 % (ref 12–46)
Lymphs Abs: 1.3 10*3/uL (ref 0.7–4.0)
MCH: 31 pg (ref 26.0–34.0)
MCHC: 32.7 g/dL (ref 30.0–36.0)
MCV: 95.1 fL (ref 78.0–100.0)
MPV: 10 fL (ref 8.6–12.4)
Monocytes Absolute: 0.5 10*3/uL (ref 0.1–1.0)
Monocytes Relative: 11 % (ref 3–12)
Neutro Abs: 2.9 10*3/uL (ref 1.7–7.7)
Neutrophils Relative %: 59 % (ref 43–77)
Platelets: 268 10*3/uL (ref 150–400)
RBC: 4.67 MIL/uL (ref 3.87–5.11)
RDW: 12.8 % (ref 11.5–15.5)
WBC: 4.9 10*3/uL (ref 4.0–10.5)

## 2015-10-30 LAB — LIPID PANEL
CHOL/HDL RATIO: 2.2 ratio (ref <=5.0)
Cholesterol: 166 mg/dL (ref 125–200)
HDL: 74 mg/dL (ref >=46)
LDL CALC: 79 mg/dL (ref <130)
TRIGLYCERIDES: 63 mg/dL (ref <150)
VLDL: 13 mg/dL (ref <30)

## 2015-10-30 LAB — COMPREHENSIVE METABOLIC PANEL
ALT: 9 U/L (ref 6–29)
AST: 18 U/L (ref 10–35)
Albumin: 4.2 g/dL (ref 3.6–5.1)
Alkaline Phosphatase: 49 U/L (ref 33–130)
BUN: 14 mg/dL (ref 7–25)
CHLORIDE: 104 mmol/L (ref 98–110)
CO2: 27 mmol/L (ref 20–31)
Calcium: 9.4 mg/dL (ref 8.6–10.4)
Creat: 0.98 mg/dL (ref 0.50–1.05)
GLUCOSE: 72 mg/dL (ref 65–99)
POTASSIUM: 3.9 mmol/L (ref 3.5–5.3)
Sodium: 140 mmol/L (ref 135–146)
Total Bilirubin: 0.9 mg/dL (ref 0.2–1.2)
Total Protein: 6.4 g/dL (ref 6.1–8.1)

## 2015-10-30 LAB — TSH: TSH: 2.155 u[IU]/mL (ref 0.350–4.500)

## 2015-10-30 NOTE — Progress Notes (Signed)
Patient ID: Katelyn Thomas, female    DOB: 1955/03/31, 60 y.o.   MRN: [id number]  PCP: No PCP Per Patient  Chief Complaint  Patient presents with  . CPE    No gyn exam  . Flu Vaccine    Subjective:   HPI: Presents for Wellness Exam.  Pap testing about 2 years ago with GYN. Last mammogram was 06/2013. Colonoscopy at age 89, due for repeat next year. Tdap is current. Needs seasonal flu vaccine.  No problems, questions or concerns today.    Patient Active Problem List   Diagnosis Date Noted  . Right wrist pain 03/24/2015  . 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 HIP PAIN, LEFT 03/05/2011  . MUSCLE WEAKNESS (GENERALIZED) 03/05/2011  . Pain in limb 11/16/2009  . ASTHMA, UNSPECIFIED 02/27/2007  . IRRITABLE BOWEL SYNDROME 02/27/2007    Past Medical History  Diagnosis Date  . Allergy   . Asthma      Prior to Admission medications   Medication Sig Start Date End Date Taking? Authorizing Provider  albuterol (PROVENTIL HFA;VENTOLIN HFA) 108 (90 BASE) MCG/ACT inhaler Inhale 2 puffs into the lungs every 6 (six) hours as needed. Patient not taking: Reported on 10/30/2015 03/23/15 03/22/16  Gerda Diss, MD  diclofenac sodium (VOLTAREN) 1 % GEL Apply 2 g topically 4 (four) times daily. Patient not taking: Reported on 10/30/2015 03/23/15   Gerda Diss, MD    Allergies  Allergen Reactions  . Codeine Nausea And Vomiting  . Penicillins     Past Surgical History  Procedure Laterality Date  . Tonsillectomy      Family History  Problem Relation Age of Onset  . Diabetes Mother   . Dementia Mother   . Glaucoma Mother   . Dementia Maternal Grandfather   . Diabetes Maternal Grandfather     Social History   Social History  . Marital Status: Married    Spouse Name: N/A  . Number of Children: 0  . Years of Education: PhD   Occupational History  . 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 Smoking status: Never Smoker   . Smokeless tobacco: Never Used  . Alcohol Use: None  . Drug Use: None  . Sexual Activity: Not Asked   Other Topics Concern  . None   Social History Narrative   Lives with her husband       Review of Systems  Constitutional: Negative.   HENT: Negative.   Eyes: Negative.   Respiratory: Negative.   Cardiovascular: Negative.   Gastrointestinal: Negative.   Genitourinary: Negative.   Musculoskeletal: Negative.   Skin: Negative.   Neurological: Negative.   Psychiatric/Behavioral: Negative.         Objective:  Physical Exam  Constitutional: She is oriented to person, place, and time. Vital signs are normal. She appears well-developed and well-nourished. She is active and cooperative. No distress.  BP 102/78 mmHg  Pulse 54  Temp(Src) 98.3 F (36.8 C) (Oral)  Resp 16  Ht 5\' 6"  (1.676 m)  Wt 136 lb 3.2 oz (61.78 kg)  BMI 21.99 kg/m2  SpO2 98%  LMP 06/23/2013   HENT:  Head: Normocephalic and atraumatic.  Right Ear: Hearing, tympanic membrane, external ear and ear canal normal. No foreign bodies.  Left Ear: Hearing, tympanic membrane, external ear and ear canal normal. No foreign bodies.  Nose: Nose normal.  Mouth/Throat: Uvula is  midline, oropharynx is clear and moist and mucous membranes are normal. No oral lesions. Normal dentition. No dental abscesses or uvula swelling. No oropharyngeal exudate.  Eyes: Conjunctivae, EOM and lids are normal. Pupils are equal, round, and reactive to light. Right eye exhibits no discharge. Left eye exhibits no discharge. No scleral icterus.  Fundoscopic exam:      The right eye shows no arteriolar narrowing, no AV nicking, no exudate, no hemorrhage and no papilledema. The right eye shows red reflex.       The left eye shows no arteriolar narrowing, no AV nicking, no exudate, no hemorrhage and no papilledema. The left eye shows red reflex.  Neck: Trachea normal, normal range of motion  and full passive range of motion without pain. Neck supple. No spinous process tenderness and no muscular tenderness present. No thyroid mass and no thyromegaly present.  Cardiovascular: Normal rate, regular rhythm, normal heart sounds, intact distal pulses and normal pulses.   Pulmonary/Chest: Effort normal and breath sounds normal. Right breast exhibits no inverted nipple, no mass, no nipple discharge, no skin change and no tenderness. Left breast exhibits no inverted nipple, no mass, no nipple discharge, no skin change and no tenderness. Breasts are symmetrical.  Musculoskeletal: She exhibits no edema or tenderness.       Cervical back: Normal.       Thoracic back: Normal.       Lumbar back: Normal.  Lymphadenopathy:       Head (right side): No tonsillar, no preauricular, no posterior auricular and no occipital adenopathy present.       Head (left side): No tonsillar, no preauricular, no posterior auricular and no occipital adenopathy present.    She has no cervical adenopathy.       Right: No supraclavicular adenopathy present.       Left: No supraclavicular adenopathy present.  Neurological: She is alert and oriented to person, place, and time. She has normal strength and normal reflexes. No cranial nerve deficit. She exhibits normal muscle tone. Coordination and gait normal.  Skin: Skin is warm, dry and intact. No rash noted. She is not diaphoretic. No cyanosis or erythema. Nails show no clubbing.  Psychiatric: She has a normal mood and affect. Her speech is normal and behavior is normal. Judgment and thought content normal.           Assessment & Plan:  1. Annual physical exam Age appropriate anticipatory guidance provided.  2. Flu vaccine need - Flu Vaccine QUAD 36+ mos IM  3. Screening for deficiency anemia - CBC with Differential/Platelet  4. Screening for metabolic disorder - Comprehensive metabolic panel  5. Screening for thyroid disorder - TSH  6. Screening for  hyperlipidemia - Lipid panel  7. Screening for HIV (human immunodeficiency virus) - HIV antibody  8. Need for hepatitis C screening test - Hepatitis C antibody  She will schedule her mammogram.  Fara Chute, PA-C Physician Assistant-Certified [facility]

## 2015-10-30 NOTE — Addendum Note (Signed)
Addended by: Fara Chute on: 10/30/2015 09:54 AM   Modules accepted: Level of Service

## 2015-10-30 NOTE — Patient Instructions (Signed)
I will contact you with your lab results as soon as they are available.   If you have not heard from me in 2 weeks, please contact me.  The fastest way to get your results is to register for My Chart (see the instructions on the last page of this printout).  Keeping You Healthy  Get These Tests  Blood Pressure- Have your blood pressure checked by your healthcare provider at least once a year.  Normal blood pressure is 120/80.  Weight- Have your body mass index (BMI) calculated to screen for obesity.  BMI is a measure of body fat based on height and weight.  You can calculate your own BMI at [email or web address].com/bmi/  Cholesterol- Have your cholesterol checked every year.  Diabetes- Have your blood sugar checked every year if you have high blood pressure, high cholesterol, a family history of diabetes or if you are overweight.  Pap Test - Have a pap test every 1 to 5 years if you have been sexually active.  If you are older than 65 and recent pap tests have been normal you may not need additional pap tests.  In addition, if you have had a hysterectomy  for benign disease additional pap tests are not necessary.  Mammogram-Yearly mammograms are essential for early detection of breast cancer  Screening for Colon Cancer- Colonoscopy starting at age 50. Screening may begin sooner depending on your family history and other health conditions.  Follow up colonoscopy as directed by your Gastroenterologist.  Screening for Osteoporosis- Screening begins at age 65 with bone density scanning, sooner if you are at higher risk for developing Osteoporosis.  Get these medicines  Calcium with Vitamin D- Your body requires 1200-1500 mg of Calcium a day and 800-1000 IU of Vitamin D a day.  You can only absorb 500 mg of Calcium at a time therefore Calcium must be taken in 2 or 3 separate doses throughout the day.  Hormones- Hormone therapy has been associated with increased risk for certain cancers and heart  disease.  Talk to your healthcare provider about if you need relief from menopausal symptoms.  Aspirin- Ask your healthcare provider about taking Aspirin to prevent Heart Disease and Stroke.  Get these Immuniztions  Flu shot- Every fall  Pneumonia shot- Once after the age of 65; if you are younger ask your healthcare provider if you need a pneumonia shot.  Tetanus- Every ten years.  Zostavax- Once after the age of 60 to prevent shingles.  Take these steps  Don't smoke- Your healthcare provider can help you quit. For tips on how to quit, ask your healthcare provider or go to www.smokefree.gov or call 1-800 QUIT-NOW.  Be physically active- Exercise 5 days a week for a minimum of 30 minutes.  If you are not already physically active, start slow and gradually work up to 30 minutes of moderate physical activity.  Try walking, dancing, bike riding, swimming, etc.  Eat a healthy diet- Eat a variety of healthy foods such as fruits, vegetables, whole grains, low fat milk, low fat cheeses, yogurt, lean meats, chicken, fish, eggs, dried beans, tofu, etc.  For more information go to www.thenutritionsource.org  Dental visit- Brush and floss teeth twice daily; visit your dentist twice a year.  Eye exam- Visit your Optometrist or Ophthalmologist yearly.  Drink alcohol in moderation- Limit alcohol intake to one drink or less a day.  Never drink and drive.  Depression- Your emotional health is as important as your physical health.  If you're   feeling down or losing interest in things you normally enjoy, please talk to your healthcare provider.  Seat Belts- can save your life; always wear one  Smoke/Carbon Monoxide detectors- These detectors need to be installed on the appropriate level of your home.  Replace batteries at least once a year.  Violence- If anyone is threatening or hurting you, please tell your healthcare provider.  Living Will/ Health care power of attorney- Discuss with your  healthcare provider and family. 

## 2015-10-31 LAB — HIV ANTIBODY (ROUTINE TESTING W REFLEX): HIV 1&2 Ab, 4th Generation: NONREACTIVE

## 2015-11-01 LAB — HEPATITIS C ANTIBODY: HCV AB: NEGATIVE

## 2015-11-03 ENCOUNTER — Telehealth: Payer: Self-pay

## 2015-11-03 NOTE — Telephone Encounter (Addendum)
Pt stopped in to drop form off for Chelle to fill out. I am placing in in Chelle's box. She can be reached @ [phone] when completed Thank you

## 2015-11-03 NOTE — Telephone Encounter (Signed)
Done and returned for to the box at the nurses' desk.

## 2016-07-30 DIAGNOSIS — Z1231 Encounter for screening mammogram for malignant neoplasm of breast: Secondary | ICD-10-CM | POA: Diagnosis not present

## 2016-07-30 DIAGNOSIS — Z01419 Encounter for gynecological examination (general) (routine) without abnormal findings: Secondary | ICD-10-CM | POA: Diagnosis not present

## 2016-07-30 DIAGNOSIS — Z1151 Encounter for screening for human papillomavirus (HPV): Secondary | ICD-10-CM | POA: Diagnosis not present

## 2016-07-30 DIAGNOSIS — Z6822 Body mass index (BMI) 22.0-22.9, adult: Secondary | ICD-10-CM | POA: Diagnosis not present

## 2016-08-14 DIAGNOSIS — H5203 Hypermetropia, bilateral: Secondary | ICD-10-CM | POA: Diagnosis not present

## 2016-10-24 DIAGNOSIS — L821 Other seborrheic keratosis: Secondary | ICD-10-CM | POA: Diagnosis not present

## 2016-10-24 DIAGNOSIS — D225 Melanocytic nevi of trunk: Secondary | ICD-10-CM | POA: Diagnosis not present

## 2016-10-24 DIAGNOSIS — L814 Other melanin hyperpigmentation: Secondary | ICD-10-CM | POA: Diagnosis not present

## 2016-10-24 DIAGNOSIS — D1801 Hemangioma of skin and subcutaneous tissue: Secondary | ICD-10-CM | POA: Diagnosis not present

## 2016-12-12 ENCOUNTER — Ambulatory Visit (INDEPENDENT_AMBULATORY_CARE_PROVIDER_SITE_OTHER): Payer: BLUE CROSS/BLUE SHIELD | Admitting: Physician Assistant

## 2016-12-12 VITALS — BP 114/74 | HR 52 | Temp 97.9°F | Resp 17 | Ht 66.0 in | Wt 139.0 lb

## 2016-12-12 DIAGNOSIS — Z0000 Encounter for general adult medical examination without abnormal findings: Secondary | ICD-10-CM | POA: Diagnosis not present

## 2016-12-12 DIAGNOSIS — Z1211 Encounter for screening for malignant neoplasm of colon: Secondary | ICD-10-CM | POA: Diagnosis not present

## 2016-12-12 DIAGNOSIS — Z23 Encounter for immunization: Secondary | ICD-10-CM

## 2016-12-12 DIAGNOSIS — L719 Rosacea, unspecified: Secondary | ICD-10-CM | POA: Diagnosis not present

## 2016-12-12 MED ORDER — ZOSTER VAC RECOMB ADJUVANTED 50 MCG/0.5ML IM SUSR: 50.0000 ug | Freq: Once | INTRAMUSCULAR | 0 refills | Status: AC

## 2016-12-12 NOTE — Patient Instructions (Addendum)
IF you received an x-ray today, you will receive an invoice from [facility]. Please contact [facility] at [phone] with questions or concerns regarding your invoice.   IF you received labwork today, you will receive an invoice from [facility]. Please contact [facility] at [phone] with questions or concerns regarding your invoice.   Our billing staff will not be able to assist you with questions regarding bills from these companies.  You will be contacted with the lab results as soon as they are available. The fastest way to get your results is to activate your My Chart account. Instructions are located on the last page of this paperwork. If you have not heard from [place] regarding the results in 2 weeks, please contact this office.    We recommend that you schedule a mammogram for breast cancer screening. Typically, you do not need a referral to do this. Please contact a local imaging center to schedule your mammogram.  [facility] - [phone]  *ask for the Radiology Department [facility]) - [phone] or [phone]  [facility] - [phone] [place] [phone] [facility] - [phone]  *ask for the [facility] - [phone]  *ask for the Radiology Department [facility] - [phone]  *ask for the [place] - [phone]          IF you received an x-ray today, you will receive an invoice from [facility]. Please contact [facility] at [phone] with questions or concerns regarding your invoice.   IF you received labwork today, you will receive an invoice from [facility]. Please contact [facility] at [phone] with questions or concerns regarding your invoice.   Our billing staff will not be able to  assist you with questions regarding bills from these companies.  You will be contacted with the lab results as soon as they are available. The fastest way to get your results is to activate your My Chart account. Instructions are located on the last page of this paperwork. If you have not heard from [place] regarding the results in 2 weeks, please contact this office.     Keeping You Healthy  Get These Tests  Blood Pressure- Have your blood pressure checked by your healthcare provider at least once a year.  Normal blood pressure is 120/80.  Weight- Have your body mass index (BMI) calculated to screen for obesity.  BMI is a measure of body fat based on height and weight.  You can calculate your own BMI at GravelBags.it  Cholesterol- Have your cholesterol checked every year.  Diabetes- Have your blood sugar checked every year if you have high blood pressure, high cholesterol, a family history of diabetes or if you are overweight.  Pap Test - Have a pap test every 1 to 5 years if you have been sexually active.  If you are older than 65 and recent pap tests have been normal you may not need additional pap tests.  In addition, if you have had a hysterectomy  for benign disease additional pap tests are not necessary.  Mammogram-Yearly mammograms are essential for early detection of breast cancer  Screening for Colon Cancer- Colonoscopy starting at age 66. Screening may begin sooner depending on your family history and other health conditions.  Follow up colonoscopy as directed by your Gastroenterologist.  Screening for Osteoporosis- Screening begins at age  41 with bone density scanning, sooner if you are at higher risk for developing Osteoporosis.  Get these medicines  Calcium with Vitamin D- Your body requires 1200-1500 mg of Calcium a day and [phone] IU of Vitamin D a day.  You can only absorb 500 mg of Calcium at a time therefore Calcium must be taken in 2 or 3 separate doses  throughout the day.  Hormones- Hormone therapy has been associated with increased risk for certain cancers and heart disease.  Talk to your healthcare provider about if you need relief from menopausal symptoms.  Aspirin- Ask your healthcare provider about taking Aspirin to prevent Heart Disease and Stroke.  Get these Immuniztions  Flu shot- Every fall  Pneumonia shot- Once after the age of 13; if you are younger ask your healthcare provider if you need a pneumonia shot.  Tetanus- Every ten years.  Zostavax- Once after the age of 37 to prevent shingles.  Take these steps  Don't smoke- Your healthcare provider can help you quit. For tips on how to quit, ask your healthcare provider or go to www.smokefree.gov or call 1-800 QUIT-NOW.  Be physically active- Exercise 5 days a week for a minimum of 30 minutes.  If you are not already physically active, start slow and gradually work up to 30 minutes of moderate physical activity.  Try walking, dancing, bike riding, swimming, etc.  Eat a healthy diet- Eat a variety of healthy foods such as fruits, vegetables, whole grains, low fat milk, low fat cheeses, yogurt, lean meats, chicken, fish, eggs, dried beans, tofu, etc.  For more information go to www.thenutritionsource.org  Dental visit- Brush and floss teeth twice daily; visit your dentist twice a year.  Eye exam- Visit your Optometrist or Ophthalmologist yearly.  Drink alcohol in moderation- Limit alcohol intake to one drink or less a day.  Never drink and drive.  Depression- Your emotional health is as important as your physical health.  If you're feeling down or losing interest in things you normally enjoy, please talk to your healthcare provider.  Seat Belts- can save your life; always wear one  Smoke/Carbon Monoxide detectors- These detectors need to be installed on the appropriate level of your home.  Replace batteries at least once a year.  Violence- If anyone is threatening or  hurting you, please tell your healthcare provider. Living Will/ Health care power of attorney- Discuss with your healthcare provider and family.Influenza (Flu) Vaccine (Inactivated or Recombinant): What You Need to Know 1. Why get vaccinated? Influenza ("flu") is a contagious disease that spreads around the [place] every year, usually between October and May. Flu is caused by influenza viruses, and is spread mainly by coughing, sneezing, and close contact. Anyone can get flu. Flu strikes suddenly and can last several days. Symptoms vary by age, but can include: fever/chills sore throat muscle aches fatigue cough headache runny or stuffy nose Flu can also lead to pneumonia and blood infections, and cause diarrhea and seizures in children. If you have a medical condition, such as heart or lung disease, flu can make it worse. Flu is more dangerous for some people. Infants and young children, people 98 years of age and older, pregnant women, and people with certain health conditions or a weakened immune system are at greatest risk. Each year thousands of people in the [place] die from flu, and many more are hospitalized. Flu vaccine can: keep you from getting flu, make flu less severe if you do get it, and keep  you from spreading flu to your family and other people. 2. Inactivated and recombinant flu vaccines A dose of flu vaccine is recommended every flu season. Children 6 months through 29 years of age may need two doses during the same flu season. Everyone else needs only one dose each flu season. Some inactivated flu vaccines contain a very small amount of a mercury-based preservative called thimerosal. Studies have not shown thimerosal in vaccines to be harmful, but flu vaccines that do not contain thimerosal are available. There is no live flu virus in flu shots. They cannot cause the flu. There are many flu viruses, and they are always changing. Each year a new flu vaccine is  made to protect against three or four viruses that are likely to cause disease in the upcoming flu season. But even when the vaccine doesn't exactly match these viruses, it may still provide some protection. Flu vaccine cannot prevent: flu that is caused by a virus not covered by the vaccine, or illnesses that look like flu but are not. It takes about 2 weeks for protection to develop after vaccination, and protection lasts through the flu season. 3. Some people should not get this vaccine Tell the person who is giving you the vaccine: If you have any severe, life-threatening allergies. If you ever had a life-threatening allergic reaction after a dose of flu vaccine, or have a severe allergy to any part of this vaccine, you may be advised not to get vaccinated. Most, but not all, types of flu vaccine contain a small amount of egg protein. If you ever had Guillain-Barr Syndrome (also called GBS). Some people with a history of GBS should not get this vaccine. This should be discussed with your doctor. If you are not feeling well. It is usually okay to get flu vaccine when you have a mild illness, but you might be asked to come back when you feel better. 4. Risks of a vaccine reaction With any medicine, including vaccines, there is a chance of reactions. These are usually mild and go away on their own, but serious reactions are also possible. Most people who get a flu shot do not have any problems with it. Minor problems following a flu shot include: soreness, redness, or swelling where the shot was given hoarseness sore, red or itchy eyes cough fever aches headache itching fatigue If these problems occur, they usually begin soon after the shot and last 1 or 2 days. More serious problems following a flu shot can include the following: There may be a small increased risk of Guillain-Barre Syndrome (GBS) after inactivated flu vaccine. This risk has been estimated at 1 or 2 additional cases per  million people vaccinated. This is much lower than the risk of severe complications from flu, which can be prevented by flu vaccine. Young children who get the flu shot along with pneumococcal vaccine (PCV13) and/or DTaP vaccine at the same time might be slightly more likely to have a seizure caused by fever. Ask your doctor for more information. Tell your doctor if a child who is getting flu vaccine has ever had a seizure. Problems that could happen after any injected vaccine: People sometimes faint after a medical procedure, including vaccination. Sitting or lying down for about 15 minutes can help prevent fainting, and injuries caused by a fall. Tell your doctor if you feel dizzy, or have vision changes or ringing in the ears. Some people get severe pain in the shoulder and have difficulty moving the arm  where a shot was given. This happens very rarely. Any medication can cause a severe allergic reaction. Such reactions from a vaccine are very rare, estimated at about 1 in a million doses, and would happen within a few minutes to a few hours after the vaccination. As with any medicine, there is a very remote chance of a vaccine causing a serious injury or death. The safety of vaccines is always being monitored. For more information, visit: http://www.aguilar.org/ 5. What if there is a serious reaction? What should I look for? Look for anything that concerns you, such as signs of a severe allergic reaction, very high fever, or unusual behavior. Signs of a severe allergic reaction can include hives, swelling of the face and throat, difficulty breathing, a fast heartbeat, dizziness, and weakness. These would start a few minutes to a few hours after the vaccination. What should I do? If you think it is a severe allergic reaction or other emergency that can't wait, call 9-1-1 and get the person to the nearest hospital. Otherwise, call your doctor. Reactions should be reported to the Vaccine Adverse  Event Reporting System (VAERS). Your doctor should file this report, or you can do it yourself through the VAERS web site at www.vaers.SamedayNews.es, or by calling 309-216-5321. VAERS does not give medical advice. 6. The National Vaccine Injury Compensation Program The [employer] Vaccine Injury Compensation Program (VICP) is a federal program that was created to compensate people who may have been injured by certain vaccines. Persons who believe they may have been injured by a vaccine can learn about the program and about filing a claim by calling 707-388-7248 or visiting the [place] website at GoldCloset.com.ee. There is a time limit to file a claim for compensation. 7. How can I learn more? Ask your healthcare provider. He or she can give you the vaccine package insert or suggest other sources of information. Call your local or state health department. Contact the Centers for Disease Control and Prevention (CDC): Call 256 600 6706 (1-800-CDC-INFO) or Visit CDC's website at https://gibson.com/ Vaccine Information Statement, Inactivated Influenza Vaccine (08/06/2014) This information is not intended to replace advice given to you by your health care provider. Make sure you discuss any questions you have with your health care provider. Document Released: 10/11/2006 Document Revised: 09/06/2016 Document Reviewed: 09/06/2016 Elsevier Interactive Patient Education  2017 [employer].

## 2016-12-12 NOTE — Progress Notes (Signed)
Patient ID: Katelyn Thomas, female    DOB: [birth year], 61 y.o.   MRN: [id number]  PCP: No PCP Per Patient  Chief Complaint  Patient presents with  . Annual Exam    Pt requesting shingles vaccine. PT ENCOURAGED TO FINISH FILLING OUT FORMS, PT GIVEN GOWN.     Subjective:   Presents for [employer].   Cervical Cancer Screening: with GYN, 2017 Breast Cancer Screening: with GYN 2017 Colorectal Cancer Screening: due this year. Normal at age 3 with Dr. Collene Mares. Bone Density Testing: not yet HIV Screening: 2016, negative STI Screening: very low Seasonal Influenza Vaccination: today Td/Tdap Vaccination: 2012 Pneumococcal Vaccination: not yet a candidate Zoster Vaccination: interested in Shingrix, but not Zostavax Frequency of Dental evaluation: Q6 months Frequency of Eye evaluation: Q6 months due to mother's glaucoma history  Is seeing a dermatologist for rosacea.    Patient Active Problem List   Diagnosis Date Noted  . Right wrist pain 03/24/2015  . 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 HIP PAIN, LEFT 03/05/2011  . MUSCLE WEAKNESS (GENERALIZED) 03/05/2011  . Pain in limb 11/16/2009  . ASTHMA, UNSPECIFIED 02/27/2007  . IRRITABLE BOWEL SYNDROME 02/27/2007    Past Medical History:  Diagnosis Date  . Allergy   . Asthma      Prior to Admission medications   Medication Sig Start Date End Date Taking? Authorizing Provider  albuterol (PROVENTIL HFA;VENTOLIN HFA) 108 (90 BASE) MCG/ACT inhaler Inhale 2 puffs into the lungs every 6 (six) hours as needed. Patient not taking: Reported on 10/30/2015 03/23/15 03/22/16  Gerda Diss, DO  clobetasol ointment (TEMOVATE) 0.05 %  10/30/16   Historical Provider, MD  metroNIDAZOLE (METROCREAM) 0.75 % cream  10/24/16   Historical Provider, MD    Allergies  Allergen Reactions  . Codeine Nausea And Vomiting  . Penicillins     Past Surgical History:  Procedure  Laterality Date  . TONSILLECTOMY      Family History  Problem Relation Age of Onset  . Diabetes Mother   . Dementia Mother   . Glaucoma Mother   . Dementia Maternal Grandfather   . Diabetes Maternal Grandfather     Social History   Social History  . Marital status: Married    Spouse name: N/A  . Number of children: 0  . Years of education: PhD   Occupational History  . 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 Smoking status: Never Smoker  . Smokeless tobacco: Never Used  . Alcohol use None  . Drug use: Unknown  . Sexual activity: Not Asked   Other Topics Concern  . None   Social History Narrative   Lives with her husband       Review of Systems  Constitutional: Negative.   HENT: Negative.   Eyes: Negative.   Respiratory: Negative.   Cardiovascular: Negative.   Gastrointestinal: Negative.   Genitourinary: Negative.   Musculoskeletal: Negative.   Skin: Negative.   Neurological: Negative.   Psychiatric/Behavioral: Negative.         Objective:  Physical Exam  Constitutional: She is oriented to person, place, and time. She appears well-developed and well-nourished. She is active and cooperative. No distress.  BP 114/74 (BP Location: Left Arm, Patient Position: Sitting, Cuff Size: Normal)   Pulse (!) 52   Temp 97.9 F (36.6 C) (Oral)   Resp 17   Ht 5\' 6"  (1.676  m)   Wt 139 lb (63 kg)   LMP 06/23/2013   SpO2 98%   BMI 22.44 kg/m   HENT:  Head: Normocephalic and atraumatic.  Right Ear: Hearing normal.  Left Ear: Hearing normal.  Eyes: Conjunctivae are normal. No scleral icterus.  Neck: Normal range of motion. Neck supple. No thyromegaly present.  Cardiovascular: Normal rate, regular rhythm and normal heart sounds.   Pulses:      Radial pulses are 2+ on the right side, and 2+ on the left side.  Pulmonary/Chest: Effort normal and breath sounds normal.  Lymphadenopathy:       Head (right side): No tonsillar, no  preauricular, no posterior auricular and no occipital adenopathy present.       Head (left side): No tonsillar, no preauricular, no posterior auricular and no occipital adenopathy present.    She has no cervical adenopathy.       Right: No supraclavicular adenopathy present.       Left: No supraclavicular adenopathy present.  Neurological: She is alert and oriented to person, place, and time. No sensory deficit.  Skin: Skin is warm, dry and intact. No rash noted. No cyanosis or erythema. Nails show no clubbing.  Psychiatric: She has a normal mood and affect. Her speech is normal and behavior is normal.           Assessment & Plan:  1. Annual physical exam Age appropriate anticipatory guidance provided.  2. Flu vaccine need - Flu Vaccine QUAD 36+ mos IM - Care order/instruction: - Care order/instruction:  3. Rosacea Continue management with dermatology  4. Screening for colon cancer - Ambulatory referral to Gastroenterology  5. Need for shingles vaccine - Zoster Vac Recomb Adjuvanted ([place]) 50 MCG SUSR; Inject 50 mcg into the muscle once.  Dispense: 1 each; Refill: 0   Return in about 1 year (around 12/12/2017).   Fara Chute, PA-C Physician Assistant-Certified [facility]

## 2017-08-01 DIAGNOSIS — Z1211 Encounter for screening for malignant neoplasm of colon: Secondary | ICD-10-CM | POA: Diagnosis not present

## 2017-08-07 DIAGNOSIS — Z1211 Encounter for screening for malignant neoplasm of colon: Secondary | ICD-10-CM | POA: Diagnosis not present

## 2017-08-07 LAB — HM COLONOSCOPY

## 2017-08-16 DIAGNOSIS — H40053 Ocular hypertension, bilateral: Secondary | ICD-10-CM | POA: Diagnosis not present

## 2017-08-16 DIAGNOSIS — H5203 Hypermetropia, bilateral: Secondary | ICD-10-CM | POA: Diagnosis not present

## 2017-09-12 DIAGNOSIS — H00025 Hordeolum internum left lower eyelid: Secondary | ICD-10-CM | POA: Diagnosis not present

## 2017-11-12 ENCOUNTER — Encounter: Payer: BLUE CROSS/BLUE SHIELD | Admitting: Physician Assistant

## 2017-11-13 ENCOUNTER — Encounter: Payer: Self-pay | Admitting: Physician Assistant

## 2017-11-13 ENCOUNTER — Other Ambulatory Visit: Payer: Self-pay

## 2017-11-13 ENCOUNTER — Ambulatory Visit (INDEPENDENT_AMBULATORY_CARE_PROVIDER_SITE_OTHER): Payer: BLUE CROSS/BLUE SHIELD | Admitting: Physician Assistant

## 2017-11-13 VITALS — BP 100/68 | HR 52 | Temp 97.6°F | Resp 18 | Ht 66.0 in | Wt 140.2 lb

## 2017-11-13 DIAGNOSIS — Z23 Encounter for immunization: Secondary | ICD-10-CM

## 2017-11-13 DIAGNOSIS — Z0000 Encounter for general adult medical examination without abnormal findings: Secondary | ICD-10-CM | POA: Diagnosis not present

## 2017-11-13 NOTE — Patient Instructions (Addendum)
IF you received an x-ray today, you will receive an invoice from [facility]. Please contact [facility] at [phone] with questions or concerns regarding your invoice.   IF you received labwork today, you will receive an invoice from [facility]. Please contact [facility] at [phone] with questions or concerns regarding your invoice.   Our billing staff will not be able to assist you with questions regarding bills from these companies.  You will be contacted with the lab results as soon as they are available. The fastest way to get your results is to activate your My Chart account. Instructions are located on the last page of this paperwork. If you have not heard from [place] regarding the results in 2 weeks, please contact this office.     Preventive Care 40-64 Years, Female Preventive care refers to lifestyle choices and visits with your health care provider that can promote health and wellness. What does preventive care include?  A yearly physical exam. This is also called an annual well check.  Dental exams once or twice a year.  Routine eye exams. Ask your health care provider how often you should have your eyes checked.  Personal lifestyle choices, including: ? Daily care of your teeth and gums. ? Regular physical activity. ? Eating a healthy diet. ? Avoiding tobacco and drug use. ? Limiting alcohol use. ? Practicing safe sex. ? Taking low-dose aspirin daily starting at age 8. ? Taking vitamin and mineral supplements as recommended by your health care provider. What happens during an annual well check? The services and screenings done by your health care provider during your annual well check will depend on your age, overall health, lifestyle risk factors, and family history of disease. Counseling Your health care provider may ask you questions about your:  Alcohol use.  Tobacco use.  Drug use.  Emotional well-being.  Home and relationship  well-being.  Sexual activity.  Eating habits.  Work and work Statistician.  Method of birth control.  Menstrual cycle.  Pregnancy history.  Screening You may have the following tests or measurements:  Height, weight, and BMI.  Blood pressure.  Lipid and cholesterol levels. These may be checked every 5 years, or more frequently if you are over 98 years old.  Skin check.  Lung cancer screening. You may have this screening every year starting at age 12 if you have a 30-pack-year history of smoking and currently smoke or have quit within the past 15 years.  Fecal occult blood test (FOBT) of the stool. You may have this test every year starting at age 34.  Flexible sigmoidoscopy or colonoscopy. You may have a sigmoidoscopy every 5 years or a colonoscopy every 10 years starting at age 58.  Hepatitis C blood test.  Hepatitis B blood test.  Sexually transmitted disease (STD) testing.  Diabetes screening. This is done by checking your blood sugar (glucose) after you have not eaten for a while (fasting). You may have this done every 1-3 years.  Mammogram. This may be done every 1-2 years. Talk to your health care provider about when you should start having regular mammograms. This may depend on whether you have a family history of breast cancer.  BRCA-related cancer screening. This may be done if you have a family history of breast, ovarian, tubal, or peritoneal cancers.  Pelvic exam and Pap test. This may be done every 3 years starting at age 28. Starting at age 57, this may be done every 5 years if you  have a Pap test in combination with an HPV test.  Bone density scan. This is done to screen for osteoporosis. You may have this scan if you are at high risk for osteoporosis.  Discuss your test results, treatment options, and if necessary, the need for more tests with your health care provider. Vaccines Your health care provider may recommend certain vaccines, such  as:  Influenza vaccine. This is recommended every year.  Tetanus, diphtheria, and acellular pertussis (Tdap, Td) vaccine. You may need a Td booster every 10 years.  Varicella vaccine. You may need this if you have not been vaccinated.  Zoster vaccine. You may need this after age 60.  Measles, mumps, and rubella (MMR) vaccine. You may need at least one dose of MMR if you were born in 1957 or later. You may also need a second dose.  Pneumococcal 13-valent conjugate (PCV13) vaccine. You may need this if you have certain conditions and were not previously vaccinated.  Pneumococcal polysaccharide (PPSV23) vaccine. You may need one or two doses if you smoke cigarettes or if you have certain conditions.  Meningococcal vaccine. You may need this if you have certain conditions.  Hepatitis A vaccine. You may need this if you have certain conditions or if you travel or work in places where you may be exposed to hepatitis A.  Hepatitis B vaccine. You may need this if you have certain conditions or if you travel or work in places where you may be exposed to hepatitis B.  Haemophilus influenzae type b (Hib) vaccine. You may need this if you have certain conditions.  Talk to your health care provider about which screenings and vaccines you need and how often you need them. This information is not intended to replace advice given to you by your health care provider. Make sure you discuss any questions you have with your health care provider. Document Released: 01/13/2016 Document Revised: 09/05/2016 Document Reviewed: 10/18/2015 Elsevier Interactive Patient Education  2017 Elsevier Inc.  

## 2017-11-13 NOTE — Progress Notes (Signed)
Patient ID: Katelyn Thomas, female    DOB: 18-Jun-1955, 62 y.o.   MRN: [id number]  PCP: Harrison Mons, PA-C  Chief Complaint  Patient presents with  . Annual Exam    No Pap needed.    Subjective:   Presents for [employer].  No new concerns or problems.  Cervical Cancer Screening: with GYN, 2017, Laurin Coder Breast Cancer Screening: with GYN 2017 Colorectal Cancer Screening: 08/07/2017 with Dr. Collene Mares. Bone Density Testing: not yet HIV Screening: 2016, negative STI Screening: very low Seasonal Influenza Vaccination: current Td/Tdap Vaccination: 2012 Pneumococcal Vaccination: not yet a candidate Zoster Vaccination: interested in Shingrix, has not received yet due to [employer]. Frequency of Dental evaluation: Q6 months Frequency of Eye evaluation: Q6 months due to mother's glaucoma history   Patient Active Problem List   Diagnosis Date Noted  . Rosacea 12/12/2016  . Right wrist pain 03/24/2015  . 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 HIP PAIN, LEFT 03/05/2011  . MUSCLE WEAKNESS (GENERALIZED) 03/05/2011  . Pain in limb 11/16/2009  . ASTHMA, UNSPECIFIED 02/27/2007  . IRRITABLE BOWEL SYNDROME 02/27/2007    Past Medical History:  Diagnosis Date  . Allergy   . Asthma      Prior to Admission medications   Medication Sig Start Date End Date Taking? Authorizing Provider  clobetasol ointment (TEMOVATE) 0.05 %  10/30/16  Yes [provider]  metroNIDAZOLE (METROCREAM) 0.75 % cream  10/24/16  Yes [provider]  albuterol (PROVENTIL HFA;VENTOLIN HFA) 108 (90 BASE) MCG/ACT inhaler Inhale 2 puffs into the lungs every 6 (six) hours as needed. Patient not taking: Reported on 10/30/2015 03/23/15 03/22/16  Gerda Diss, DO    Allergies  Allergen Reactions  . Codeine Nausea And Vomiting  . Penicillins     Past Surgical History:  Procedure Laterality Date  . TONSILLECTOMY       Family History  Problem Relation Age of Onset  . Diabetes Mother   . Dementia Mother   . Glaucoma Mother   . Dementia Maternal Grandfather   . Diabetes Maternal Grandfather     Social History   Socioeconomic History  . Marital status: Married    Spouse name: None  . Number of children: 0  . Years of education: PhD  . Highest education level: None  Social Needs  . Financial resource strain: None  . Food insecurity - worry: None  . Food insecurity - inability: None  . Transportation needs - medical: None  . Transportation needs - non-medical: None  Occupational History  . 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 Smoking status: Never Smoker  . Smokeless tobacco: Never Used  Substance and Sexual Activity  . Alcohol use: None  . Drug use: None  . Sexual activity: None  Other Topics Concern  . None  Social History Narrative   Lives with her husband       Review of Systems  Constitutional: Negative.   HENT: Negative.   Eyes: Negative.   Respiratory: Negative.   Cardiovascular: Negative.   Gastrointestinal: Negative.   Endocrine: Negative.   Genitourinary: Negative.   Musculoskeletal: Negative.   Skin: Negative.   Allergic/Immunologic: Negative.   Neurological: Negative.   Hematological: Negative.   Psychiatric/Behavioral: Negative.         Objective:  Physical Exam  Constitutional: She is oriented to person, place, and time. Vital signs are normal.  She appears well-developed and well-nourished. She is active and cooperative. No distress.  BP 100/68 (BP Location: Left Arm, Patient Position: Sitting, Cuff Size: Normal)   Pulse (!) 52   Temp 97.6 F (36.4 C) (Oral)   Resp 18   Ht 5\' 6"  (1.676 m)   Wt 140 lb 3.2 oz (63.6 kg)   LMP 06/23/2013   SpO2 100%   BMI 22.63 kg/m    HENT:  Head: Normocephalic and atraumatic.  Right Ear: Hearing, tympanic membrane, external ear and ear canal normal. No foreign bodies.   Left Ear: Hearing, tympanic membrane, external ear and ear canal normal. No foreign bodies.  Nose: Nose normal.  Mouth/Throat: Uvula is midline, oropharynx is clear and moist and mucous membranes are normal. No oral lesions. Normal dentition. No dental abscesses or uvula swelling. No oropharyngeal exudate.  Eyes: Conjunctivae, EOM and lids are normal. Pupils are equal, round, and reactive to light. Right eye exhibits no discharge. Left eye exhibits no discharge. No scleral icterus.  Fundoscopic exam:      The right eye shows no arteriolar narrowing, no AV nicking, no exudate, no hemorrhage and no papilledema. The right eye shows red reflex.       The left eye shows no arteriolar narrowing, no AV nicking, no exudate, no hemorrhage and no papilledema. The left eye shows red reflex.  Neck: Trachea normal, normal range of motion and full passive range of motion without pain. Neck supple. No spinous process tenderness and no muscular tenderness present. No thyroid mass and no thyromegaly present.  Cardiovascular: Normal rate, regular rhythm, normal heart sounds, intact distal pulses and normal pulses.  Pulmonary/Chest: Effort normal and breath sounds normal.  Musculoskeletal: She exhibits no edema or tenderness.       Cervical back: Normal.       Thoracic back: Normal.       Lumbar back: Normal.  Lymphadenopathy:       Head (right side): No tonsillar, no preauricular, no posterior auricular and no occipital adenopathy present.       Head (left side): No tonsillar, no preauricular, no posterior auricular and no occipital adenopathy present.    She has no cervical adenopathy.       Right: No supraclavicular adenopathy present.       Left: No supraclavicular adenopathy present.  Neurological: She is alert and oriented to person, place, and time. She has normal strength and normal reflexes. No cranial nerve deficit. She exhibits normal muscle tone. Coordination and gait normal.  Skin: Skin is warm, dry  and intact. No rash noted. She is not diaphoretic. No cyanosis or erythema. Nails show no clubbing.  Psychiatric: She has a normal mood and affect. Her speech is normal and behavior is normal. Judgment and thought content normal.       Assessment & Plan:  1. Annual physical exam Age appropriate anticipatory guidance provided. - CBC with Differential/Platelet - Comprehensive metabolic panel - Lipid panel - TSH - Urinalysis, dipstick only  2. Need for influenza vaccination - Flu Vaccine QUAD 36+ mos IM    Return in about 1 year (around 11/13/2018) for wellness exam.   Fara Chute, PA-C [facility]

## 2017-11-14 LAB — TSH: TSH: 2.55 u[IU]/mL (ref 0.450–4.500)

## 2017-11-14 LAB — LIPID PANEL
Chol/HDL Ratio: 2 ratio (ref 0.0–4.4)
Cholesterol, Total: 184 mg/dL (ref 100–199)
HDL: 90 mg/dL (ref >39)
LDL Calculated: 84 mg/dL (ref 0–99)
Triglycerides: 52 mg/dL (ref 0–149)
VLDL Cholesterol Cal: 10 mg/dL (ref 5–40)

## 2017-11-14 LAB — CBC WITH DIFFERENTIAL/PLATELET
Basophils Absolute: 0 10*3/uL (ref 0.0–0.2)
Basos: 0 %
EOS (ABSOLUTE): 0.1 10*3/uL (ref 0.0–0.4)
Eos: 2 %
Hematocrit: 42.3 % (ref 34.0–46.6)
Hemoglobin: 13.7 g/dL (ref 11.1–15.9)
Immature Grans (Abs): 0 10*3/uL (ref 0.0–0.1)
Immature Granulocytes: 0 %
LYMPHS ABS: 1.4 10*3/uL (ref 0.7–3.1)
LYMPHS: 28 %
MCH: 31.1 pg (ref 26.6–33.0)
MCHC: 32.4 g/dL (ref 31.5–35.7)
MCV: 96 fL (ref 79–97)
MONOS ABS: 0.4 10*3/uL (ref 0.1–0.9)
Monocytes: 7 %
NEUTROS ABS: 3 10*3/uL (ref 1.4–7.0)
Neutrophils: 63 %
PLATELETS: 254 10*3/uL (ref 150–379)
RBC: 4.41 x10E6/uL (ref 3.77–5.28)
RDW: 13.2 % (ref 12.3–15.4)
WBC: 4.9 10*3/uL (ref 3.4–10.8)

## 2017-11-14 LAB — COMPREHENSIVE METABOLIC PANEL
A/G RATIO: 2.5 — AB (ref 1.2–2.2)
ALT: 14 IU/L (ref 0–32)
AST: 29 IU/L (ref 0–40)
Albumin: 4.5 g/dL (ref 3.6–4.8)
Alkaline Phosphatase: 46 IU/L (ref 39–117)
BILIRUBIN TOTAL: 0.6 mg/dL (ref 0.0–1.2)
BUN/Creatinine Ratio: 14 (ref 12–28)
BUN: 12 mg/dL (ref 8–27)
CHLORIDE: 104 mmol/L (ref 96–106)
CO2: 22 mmol/L (ref 20–29)
Calcium: 9.4 mg/dL (ref 8.7–10.3)
Creatinine, Ser: 0.85 mg/dL (ref 0.57–1.00)
GFR calc non Af Amer: 74 mL/min/{1.73_m2} (ref >59)
GFR, EST AFRICAN AMERICAN: 86 mL/min/{1.73_m2} (ref >59)
Globulin, Total: 1.8 g/dL (ref 1.5–4.5)
Glucose: 75 mg/dL (ref 65–99)
POTASSIUM: 4.7 mmol/L (ref 3.5–5.2)
Sodium: 143 mmol/L (ref 134–144)
TOTAL PROTEIN: 6.3 g/dL (ref 6.0–8.5)

## 2017-11-14 LAB — URINALYSIS, DIPSTICK ONLY
Bilirubin, UA: NEGATIVE
Glucose, UA: NEGATIVE
Ketones, UA: NEGATIVE
LEUKOCYTES UA: NEGATIVE
Nitrite, UA: NEGATIVE
PH UA: 6.5 (ref 5.0–7.5)
PROTEIN UA: NEGATIVE
RBC, UA: NEGATIVE
Specific Gravity, UA: 1.005 (ref 1.005–1.030)
Urobilinogen, Ur: 0.2 mg/dL (ref 0.2–1.0)

## 2018-04-07 ENCOUNTER — Encounter: Payer: Self-pay | Admitting: Physician Assistant

## 2018-08-15 DIAGNOSIS — H40013 Open angle with borderline findings, low risk, bilateral: Secondary | ICD-10-CM | POA: Diagnosis not present

## 2018-08-15 DIAGNOSIS — H40053 Ocular hypertension, bilateral: Secondary | ICD-10-CM | POA: Diagnosis not present

## 2018-08-15 DIAGNOSIS — H5203 Hypermetropia, bilateral: Secondary | ICD-10-CM | POA: Diagnosis not present

## 2018-09-29 DIAGNOSIS — Z23 Encounter for immunization: Secondary | ICD-10-CM | POA: Diagnosis not present

## 2018-10-02 DIAGNOSIS — Z1231 Encounter for screening mammogram for malignant neoplasm of breast: Secondary | ICD-10-CM | POA: Diagnosis not present

## 2018-10-14 DIAGNOSIS — L821 Other seborrheic keratosis: Secondary | ICD-10-CM | POA: Diagnosis not present

## 2018-10-14 DIAGNOSIS — L570 Actinic keratosis: Secondary | ICD-10-CM | POA: Diagnosis not present

## 2018-10-14 DIAGNOSIS — D225 Melanocytic nevi of trunk: Secondary | ICD-10-CM | POA: Diagnosis not present

## 2018-10-14 DIAGNOSIS — L814 Other melanin hyperpigmentation: Secondary | ICD-10-CM | POA: Diagnosis not present

## 2018-11-24 DIAGNOSIS — Z6822 Body mass index (BMI) 22.0-22.9, adult: Secondary | ICD-10-CM | POA: Diagnosis not present

## 2018-11-24 DIAGNOSIS — Z0000 Encounter for general adult medical examination without abnormal findings: Secondary | ICD-10-CM | POA: Diagnosis not present

## 2018-12-10 DIAGNOSIS — Z23 Encounter for immunization: Secondary | ICD-10-CM | POA: Diagnosis not present

## 2019-02-26 DIAGNOSIS — H40053 Ocular hypertension, bilateral: Secondary | ICD-10-CM | POA: Diagnosis not present

## 2019-02-26 DIAGNOSIS — H40013 Open angle with borderline findings, low risk, bilateral: Secondary | ICD-10-CM | POA: Diagnosis not present

## 2019-08-27 DIAGNOSIS — H40013 Open angle with borderline findings, low risk, bilateral: Secondary | ICD-10-CM | POA: Diagnosis not present

## 2019-08-28 DIAGNOSIS — Z20828 Contact with and (suspected) exposure to other viral communicable diseases: Secondary | ICD-10-CM | POA: Diagnosis not present

## 2019-08-28 DIAGNOSIS — Z7189 Other specified counseling: Secondary | ICD-10-CM | POA: Diagnosis not present

## 2019-09-25 DIAGNOSIS — Z7189 Other specified counseling: Secondary | ICD-10-CM | POA: Diagnosis not present

## 2019-09-25 DIAGNOSIS — Z20828 Contact with and (suspected) exposure to other viral communicable diseases: Secondary | ICD-10-CM | POA: Diagnosis not present

## 2019-09-30 DIAGNOSIS — M5137 Other intervertebral disc degeneration, lumbosacral region: Secondary | ICD-10-CM | POA: Diagnosis not present

## 2019-09-30 DIAGNOSIS — M9905 Segmental and somatic dysfunction of pelvic region: Secondary | ICD-10-CM | POA: Diagnosis not present

## 2019-09-30 DIAGNOSIS — M25552 Pain in left hip: Secondary | ICD-10-CM | POA: Diagnosis not present

## 2019-09-30 DIAGNOSIS — M9903 Segmental and somatic dysfunction of lumbar region: Secondary | ICD-10-CM | POA: Diagnosis not present

## 2019-10-01 DIAGNOSIS — M9903 Segmental and somatic dysfunction of lumbar region: Secondary | ICD-10-CM | POA: Diagnosis not present

## 2019-10-01 DIAGNOSIS — M9905 Segmental and somatic dysfunction of pelvic region: Secondary | ICD-10-CM | POA: Diagnosis not present

## 2019-10-01 DIAGNOSIS — M25552 Pain in left hip: Secondary | ICD-10-CM | POA: Diagnosis not present

## 2019-10-01 DIAGNOSIS — M5137 Other intervertebral disc degeneration, lumbosacral region: Secondary | ICD-10-CM | POA: Diagnosis not present

## 2019-10-19 ENCOUNTER — Ambulatory Visit (INDEPENDENT_AMBULATORY_CARE_PROVIDER_SITE_OTHER): Payer: BC Managed Care – PPO | Admitting: Family Medicine

## 2019-10-19 DIAGNOSIS — Z23 Encounter for immunization: Secondary | ICD-10-CM

## 2019-10-29 DIAGNOSIS — M5137 Other intervertebral disc degeneration, lumbosacral region: Secondary | ICD-10-CM | POA: Diagnosis not present

## 2019-10-29 DIAGNOSIS — M9905 Segmental and somatic dysfunction of pelvic region: Secondary | ICD-10-CM | POA: Diagnosis not present

## 2019-10-29 DIAGNOSIS — M25552 Pain in left hip: Secondary | ICD-10-CM | POA: Diagnosis not present

## 2019-10-29 DIAGNOSIS — M9903 Segmental and somatic dysfunction of lumbar region: Secondary | ICD-10-CM | POA: Diagnosis not present

## 2019-11-17 ENCOUNTER — Other Ambulatory Visit: Payer: Self-pay

## 2019-11-17 ENCOUNTER — Encounter: Payer: Self-pay | Admitting: Sports Medicine

## 2019-11-17 ENCOUNTER — Ambulatory Visit: Payer: BC Managed Care – PPO | Admitting: Sports Medicine

## 2019-11-17 VITALS — BP 104/72 | Ht 66.0 in | Wt 138.0 lb

## 2019-11-17 DIAGNOSIS — D2121 Benign neoplasm of connective and other soft tissue of right lower limb, including hip: Secondary | ICD-10-CM | POA: Diagnosis not present

## 2019-11-17 MED ORDER — AMBULATORY NON FORMULARY MEDICATION: 0 refills | Status: DC

## 2019-11-17 NOTE — Progress Notes (Signed)
PCP: No primary care provider on file.  Subjective:   HPI: Patient is a 64 y.o. female here for evaluation of left foot pain.  Patient notes the pain started approximately 1 week ago.  The pain is located on the plantar aspect of her foot proximal to her second metatarsal head.  Patient notes she noticed a small bump on her foot when the pain started.  It felt very similar to a fibroma of her all her foot that she had back in the 90s.  This is treated by Dr. Oneida Alar.  She was given a metatarsal pad with a hole cut in it to help offload that area.  Patient notes since she noticed this pop up on her left foot she has been using donut pads to help unload the bump on her foot.  She notes this has slightly improved her pain.  Patient notes she is very active and is a big walker but the pain has limited her from doing this.  Patient has not tried any other therapies for her foot pain.  She denies any specific injury or trauma to her foot.  She denies any numbness or tingling.  She denies any bruising or swelling.  Patient notes she did some research and fibromas of the feet and red there may be a topical cream that you can try for these however she does not remember the specific name of the medication.   Review of Systems: See HPI above.  Past Medical History:  Diagnosis Date  . Allergy   . Asthma     Current Outpatient Medications on File Prior to Visit  Medication Sig Dispense Refill  . albuterol (VENTOLIN HFA) 108 (90 Base) MCG/ACT inhaler Inhale into the lungs.    Marland Kitchen albuterol (PROVENTIL HFA;VENTOLIN HFA) 108 (90 BASE) MCG/ACT inhaler Inhale 2 puffs into the lungs every 6 (six) hours as needed. (Patient not taking: Reported on 10/30/2015) 1 Inhaler 3  . clobetasol ointment (TEMOVATE) 0.05 %   0  . metroNIDAZOLE (METROCREAM) 0.75 % cream   1   No current facility-administered medications on file prior to visit.     Past Surgical History:  Procedure Laterality Date  . TONSILLECTOMY       Allergies  Allergen Reactions  . Codeine Nausea And Vomiting  . Penicillins     Social History   Socioeconomic History  . Marital status: Married    Spouse name: Not on file  . Number of children: 0  . Years of education: PhD  . Highest education level: Not on file  Occupational History  . 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 Financial resource strain: Not on file  . Food insecurity    Worry: Not on file    Inability: Not on file  . Transportation needs    Medical: Not on file    Non-medical: Not on file  Tobacco Use  . Smoking status: Never Smoker  . Smokeless tobacco: Never Used  Substance and Sexual Activity  . Alcohol use: Not on file  . Drug use: Not on file  . Sexual activity: Not on file  Lifestyle  . Physical activity    Days per week: Not on file    Minutes per session: Not on file  . Stress: Not on file  Relationships  . Social Herbalist on phone: Not on file    Gets together: Not on file    Attends religious service:  Not on file    Active member of club or organization: Not on file    Attends meetings of clubs or organizations: Not on file    Relationship status: Not on file  . Intimate partner violence    Fear of current or ex partner: Not on file    Emotionally abused: Not on file    Physically abused: Not on file    Forced sexual activity: Not on file  Other Topics Concern  . Not on file  Social History Narrative   Lives with her husband    Family History  Problem Relation Age of Onset  . Diabetes Mother   . Dementia Mother   . Glaucoma Mother   . Dementia Maternal Grandfather   . Diabetes Maternal Grandfather         Objective:  Physical Exam: BP 104/72   Ht 5\' 6"  (1.676 m)   Wt 138 lb (62.6 kg)   LMP 06/23/2013   BMI 22.27 kg/m  Gen: NAD, comfortable in exam room Lungs: Breathing comfortably on room air Ankle/Foot Exam Left -Inspection: No deformity, no  discoloration.  Loss of transverse arch.  Preservation of longitudinal arch. -Palpation: Patient has a small tender mass on the plantar aspect of her foot proximal to the second metatarsal head. -ROM: Normal ROM with dorsiflexion, plantarflexion, inversion, eversion -Strength: Dorsiflexion: 5/5; Plantarflexion: 5/5; Inversion: 5/5; Eversion: 5/5 -Limb neurovascularly intact, no instability noted  Contralateral Ankle -Inspection: No deformity, no discoloration. Normal longitudinal and transverse arch -Palpation: Medial malleolus: non-tender; lateral malleolus: non-tender; 5th metatarsal: non-tender; calcaneus: non-tender; plantar fascia insertion: non-tender -ROM: Normal ROM with dorsiflexion, plantarflexion, inversion, eversion -Strength: Dorsiflexion: 5/5; Plantarflexion: 5/5; Inversion: 5/5; Eversion: 5/5 -Limb neurovascularly intact, no instability noted  -Gait: Normal    Assessment & Plan:  Patient is a 64 y.o. female here for evaluation of left foot pain  1.  Fibroma of the left foot -Patient was given a dancers pad to wear in her left shoe.  She may wear this or the donut pad which she has been using -Patient may cross train as needed to help reduce pressure off of her foot with exercise.  She may try swimming and/or biking -Patient will look up the medication which she read about online and give [place] a call with the name of that medication.  After that we will look into this option further for her  Patient will follow-up as needed  Patient seen and evaluated with the sports medicine fellow.  I agree with the above plan of care.  Patient called the office to notify us that the name of the topical medication was 15% transdermal verapamil to be applied twice daily.  This will need to be compounded so we will call it into a local compounding pharmacy.  She will continue to use her donut pad to offload the area of tenderness.  Hopefully this will resolve on its own just as it did several  years ago.  Follow-up for ongoing or recalcitrant issues.

## 2019-11-30 DIAGNOSIS — Z0000 Encounter for general adult medical examination without abnormal findings: Secondary | ICD-10-CM | POA: Diagnosis not present

## 2019-11-30 DIAGNOSIS — Z124 Encounter for screening for malignant neoplasm of cervix: Secondary | ICD-10-CM | POA: Diagnosis not present

## 2019-11-30 DIAGNOSIS — E2839 Other primary ovarian failure: Secondary | ICD-10-CM | POA: Diagnosis not present

## 2019-11-30 DIAGNOSIS — Z1231 Encounter for screening mammogram for malignant neoplasm of breast: Secondary | ICD-10-CM | POA: Diagnosis not present

## 2019-11-30 DIAGNOSIS — Z13220 Encounter for screening for lipoid disorders: Secondary | ICD-10-CM | POA: Diagnosis not present

## 2019-11-30 DIAGNOSIS — Z131 Encounter for screening for diabetes mellitus: Secondary | ICD-10-CM | POA: Diagnosis not present

## 2020-02-12 DIAGNOSIS — Z1231 Encounter for screening mammogram for malignant neoplasm of breast: Secondary | ICD-10-CM | POA: Diagnosis not present

## 2020-02-12 DIAGNOSIS — M8589 Other specified disorders of bone density and structure, multiple sites: Secondary | ICD-10-CM | POA: Diagnosis not present

## 2020-02-12 DIAGNOSIS — M85852 Other specified disorders of bone density and structure, left thigh: Secondary | ICD-10-CM | POA: Diagnosis not present

## 2020-02-12 DIAGNOSIS — M8588 Other specified disorders of bone density and structure, other site: Secondary | ICD-10-CM | POA: Diagnosis not present

## 2020-02-12 DIAGNOSIS — E2839 Other primary ovarian failure: Secondary | ICD-10-CM | POA: Diagnosis not present

## 2020-03-04 ENCOUNTER — Ambulatory Visit: Payer: BC Managed Care – PPO | Attending: Internal Medicine

## 2020-03-04 ENCOUNTER — Ambulatory Visit: Payer: BC Managed Care – PPO

## 2020-03-04 DIAGNOSIS — Z23 Encounter for immunization: Secondary | ICD-10-CM | POA: Insufficient documentation

## 2020-03-04 NOTE — Progress Notes (Signed)
   Covid-19 Vaccination Clinic  Name:  Katelyn Thomas    MRN: [id number] DOB: 1955/02/16  03/04/2020  Katelyn Thomas was observed post Covid-19 immunization for 15 minutes without incident. She was provided with Vaccine Information Sheet and instruction to access the V-Safe system.   Katelyn Thomas was instructed to call 911 with any severe reactions post vaccine: Marland Kitchen Difficulty breathing  . Swelling of face and throat  . A fast heartbeat  . A bad rash all over body  . Dizziness and weakness   Immunizations Administered    Name Date Dose VIS Date Route   Pfizer COVID-19 Vaccine 03/04/2020  6:41 PM 0.3 mL 12/11/2019 Intramuscular   Manufacturer: [place]   Lot: [id number]   [place]: [id number]

## 2020-03-10 DIAGNOSIS — H40013 Open angle with borderline findings, low risk, bilateral: Secondary | ICD-10-CM | POA: Diagnosis not present

## 2020-04-05 ENCOUNTER — Ambulatory Visit: Payer: BC Managed Care – PPO | Attending: Internal Medicine

## 2020-04-05 DIAGNOSIS — Z23 Encounter for immunization: Secondary | ICD-10-CM

## 2020-04-05 NOTE — Progress Notes (Signed)
   Covid-19 Vaccination Clinic  Name:  Katelyn Thomas    MRN: [id number] DOB: 11/07/1955  04/05/2020  [name] was observed post Covid-19 immunization for 15 minutes without incident. She was provided with Vaccine Information Sheet and instruction to access the V-Safe system.   [name] was instructed to call 911 with any severe reactions post vaccine: Marland Kitchen Difficulty breathing  . Swelling of face and throat  . A fast heartbeat  . A bad rash all over body  . Dizziness and weakness   Immunizations Administered    Name Date Dose VIS Date Route   Pfizer COVID-19 Vaccine 04/05/2020  8:29 AM 0.3 mL 12/11/2019 Intramuscular   Manufacturer: [employer], [employer]   Lot: [id number]   [place]: [id number]

## 2020-05-02 DIAGNOSIS — M6281 Muscle weakness (generalized): Secondary | ICD-10-CM | POA: Diagnosis not present

## 2020-05-03 DIAGNOSIS — D225 Melanocytic nevi of trunk: Secondary | ICD-10-CM | POA: Diagnosis not present

## 2020-05-03 DIAGNOSIS — L814 Other melanin hyperpigmentation: Secondary | ICD-10-CM | POA: Diagnosis not present

## 2020-05-03 DIAGNOSIS — L578 Other skin changes due to chronic exposure to nonionizing radiation: Secondary | ICD-10-CM | POA: Diagnosis not present

## 2020-05-03 DIAGNOSIS — L821 Other seborrheic keratosis: Secondary | ICD-10-CM | POA: Diagnosis not present

## 2020-06-30 DIAGNOSIS — H40053 Ocular hypertension, bilateral: Secondary | ICD-10-CM | POA: Diagnosis not present

## 2020-07-21 ENCOUNTER — Other Ambulatory Visit: Payer: Self-pay

## 2020-07-21 ENCOUNTER — Ambulatory Visit: Payer: BC Managed Care – PPO | Admitting: Sports Medicine

## 2020-07-21 VITALS — BP 106/82 | Ht 66.0 in | Wt 133.0 lb

## 2020-07-21 DIAGNOSIS — M25512 Pain in left shoulder: Secondary | ICD-10-CM

## 2020-07-21 NOTE — Patient Instructions (Addendum)
Thank you for coming in to see [place] today! Please see below to review our plan for today's visit:  1. Avoid strengthening exercises using the shoulders as this can worsen your symptoms. Instead really focus on range of motion and stretching. 2. Do your shoulder range of motion exercises at least once daily. 3. Follow up with [place] in 1 month. If not much improved by then we can do an ultrasound of your shoulder here in the office.   Please call the clinic at [phone] if your symptoms worsen or you have any concerns. It was our pleasure to serve you!   Dr. Everlean Alstrom Dr. Milus Banister [facility]   Adhesive Capsulitis  Adhesive capsulitis, also called frozen shoulder, causes the shoulder to become stiff and painful to move. This condition happens when there is inflammation of the tendons and ligaments that surround the shoulder joint (shoulder capsule). What are the causes? This condition may be caused by:  An injury to your shoulder joint.  Straining your shoulder.  Not moving your shoulder for a period of time. This can happen if your arm was injured or in a sling.  Long-standing conditions, such as: ? Diabetes. ? Thyroid problems. ? Heart disease. ? Stroke. ? Rheumatoid arthritis. ? Lung disease. In some cases, the cause is not known. What increases the risk? You are more likely to develop this condition if you are:  A woman.  Older than 64 years of age. What are the signs or symptoms? Symptoms of this condition include:  Pain in your shoulder when you move your arm. There may also be pain when parts of your shoulder are touched. The pain may be worse at night or when you are resting.  A sore or aching shoulder.  The inability to move your shoulder normally.  Muscle spasms. How is this diagnosed? This condition is diagnosed with a physical exam and imaging tests, such as an X-ray or MRI. How is this treated? This condition may be treated  with:  Treatment of the underlying cause or condition.  Medicine. Medicine may be given to relieve pain, inflammation, or muscle spasms.  Steroid injections into the shoulder joint.  Physical therapy. This involves performing exercises to get the shoulder moving again.  Acupuncture. This is a type of treatment that involves stimulating specific points on your body by inserting thin needles through your skin.  Shoulder manipulation. This is a procedure to move the shoulder into another position. It is done after you are given a medicine to make you fall asleep (general anesthetic). The joint may also be injected with salt water at high pressure to break down scarring.  Surgery. This may be done in severe cases when other treatments have failed. Although most people recover completely from adhesive capsulitis, some may not regain full shoulder movement. Follow these instructions at home: Managing pain, stiffness, and swelling      If directed, put ice on the injured area: ? Put ice in a plastic bag. ? Place a towel between your skin and the bag. ? Leave the ice on for 20 minutes, 2-3 times per day.  If directed, apply heat to the affected area before you exercise. Use the heat source that your health care provider recommends, such as a moist heat pack or a heating pad. ? Place a towel between your skin and the heat source. ? Leave the heat on for 20-30 minutes. ? Remove the heat if your skin turns bright red.  This is especially important if you are unable to feel pain, heat, or cold. You may have a greater risk of getting burned. General instructions  Take over-the-counter and prescription medicines only as told by your health care provider.  If you are being treated with physical therapy, follow instructions from your physical therapist.  Avoid exercises that put a lot of demand on your shoulder, such as throwing. These exercises can make pain worse.  Keep all follow-up visits  as told by your health care provider. This is important. Contact a health care provider if:  You develop new symptoms.  Your symptoms get worse. Summary  Adhesive capsulitis, also called frozen shoulder, causes the shoulder to become stiff and painful to move.  You are more likely to have this condition if you are a woman and over age 17.  It is treated with physical therapy, medicines, and sometimes surgery.

## 2020-07-21 NOTE — Progress Notes (Signed)
°  Katelyn Thomas - 65 y.o. female MRN [id number]  Date of birth: Jul 28, 1955    SUBJECTIVE:      Chief Complaint:/ HPI:  Left shoulder pain: Patient presents to clinic following a gradual onset of left shoulder pain that started sometime when she was working out with her TRX system at home.  Reports that she started to rest the shoulder and not lifting as many weights.  Reports that her shoulder feels a little bit better today but feels now like the progress has plateaued.  Reports she is most concerned with the decreased range of motion of the left arm in abduction and internal rotation.  Has been using conservative management for the pain, which is now gone.  Denies any other injuries, traumas, accidents, rashes, fevers, joint swelling.  Denies any weakness questions.   ROS:     See HPI  PERTINENT  PMH / PSH FH / / SH:  Past Medical, Surgical, Social, and Family History Reviewed & Updated in the EMR.  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  GEN: Alert and oriented, NAD Pulm: Breathing unlabored PSY: normal mood, congruent affect  MSK: Left shoulder: Inspection: No obvious deformity, atrophy, asymmetry, bruising, swelling -Palpation: No TTP -ROM: Full ROM in flexion, extension; reduced in abduction, internal and external rotation -Strength: 5/5 strength in flexion, extension, abduction, internal and external rotation Special Tests, left shoulder: Neg Hawkins, negative  empty can sign, negative O'Brien   ASSESSMENT & PLAN:  1. Frozen Shoulder, left shoulder: Decreased left shoulder range of motion appreciated in external rotation, internal rotation, and abduction -Instructed to avoid strengthening exercises as this can worsen symptoms -Focus on range of motion and stretching of the left shoulder -Patient given shoulder range of motion exercises to be performed at least once daily -Follow-up 1 month.  If not much improved can do ultrasound of left shoulder to evaluate rotator cuff  Milus Banister, [place], PGY-3 07/21/2020 12:04 PM   Patient seen and evaluated with the resident.  I agree with the above plan of care.  Patient definitely has some limited range of motion, specifically with abduction and internal rotation.  Minimal pain.  No significant weakness with rotator cuff stressing.  We will give her some range of motion exercises to start doing at home.  She is instructed to refrain from any upper body strengthening exercises at this time.  Follow-up in 1 month for reevaluation.  If symptoms are not improving at her follow-up visit, we will plan on doing an MSK ultrasound of her shoulder at that time to rule out rotator cuff pathology.

## 2020-08-23 ENCOUNTER — Ambulatory Visit: Payer: BC Managed Care – PPO | Admitting: Sports Medicine

## 2020-08-23 ENCOUNTER — Other Ambulatory Visit: Payer: Self-pay

## 2020-08-23 VITALS — BP 104/64 | Ht 66.0 in | Wt 132.0 lb

## 2020-08-23 DIAGNOSIS — M7502 Adhesive capsulitis of left shoulder: Secondary | ICD-10-CM | POA: Diagnosis not present

## 2020-08-23 NOTE — Progress Notes (Signed)
Office Visit Note   Patient: Katelyn Thomas           Date of Birth: 03-May-1955           MRN: [id number] Visit Date: 08/23/2020 Requested by: No referring provider defined for this encounter. PCP: No primary care provider on file.  Subjective: CC: F/U Left Shoulder Pain  HPI: 65 year old female presenting to clinic today to follow-up on left shoulder pain, presumptive adhesive capsulitis.  Patient states that since her last encounter she has noticed significant improvement in her range of motion and pain.  She has been compliant with her daily exercises, stating that she will do gentle range of motion and stretches as instructed at her last encounter.  This has offered a marked benefit in the functionality of her arm.  She remains with a little discomfort with external rotation, but otherwise feels as though she is doing much better.  She is curious to know if there would be any benefit from more aggressive physical therapy at this point.              ROS:   All other systems were reviewed and are negative.  Objective: Vital Signs: BP 104/64   Ht 5\' 6"  (1.676 m)   Wt 132 lb (59.9 kg)   LMP 06/23/2013   BMI 21.31 kg/m   Physical Exam:  General:  Alert and oriented, in no acute distress. Cardiac: Appears well perfused. Pulm:  Breathing unlabored. Psy:  Normal mood, congruent affect. Skin: No rashes, bruising, or other skin lesions appreciated.  Left shoulder:  Shoulders with symmetrical muscle mass, no obvious deformity or scapular winging.  Range of motion limited in extremes of abduction, with left arm lagging behind right.  Full range of motion with forward flexion, and extension.  Apley scratch at approximately level of T8 on left, T5 on right.  External rotation limited on left to approximately 30 degrees.  Palpation:  No tenderness to palpation over [facility] joint, or biceps tendon.  No tenderness within subacromial area.   Rotator cuff testing:  Full strength with no pain  on empty can.  Full strength and no pain with belly press and lift off.  Good strength with resisted internal and external rotation.  Labral testing:  No significant pain with speeds/O'Brien's.   Imaging: Extremity ultrasound-left shoulder:  Biceps tendon easily visualized in long and short axis with no surrounding effusion, or subluxation.  Supraspinatus, subscapularis, infraspinatus, and teres minor tendons were all visualized with no significant effusion, or evidence of tears.  No evidence of impingement beneath the acromion with dynamic testing.  Pectoral insertion site on the humerus was easily visualized, fibers intact, with no surrounding fluid.  Glenohumeral joint easily visualized, with no significant effusion.  Posterior labrum appreciated, with no obvious derangements.   Impression: Normal shoulder ultrasound.   Assessment & Plan: 65 year old female presenting to clinic to follow-up on diagnosis of left adhesive capsulitis.  Patient has made significant improvement with gentle range of motion exercises at home, and is very happy with the progress she has made thus far.  She would like to advance in physical therapy to try and speed her recovery.  -We will place referral to physical therapy for further stretching education, though recommend she continue to avoid vigorous weightlifting exercise with the affected arm until her symptoms have completely resolved.  -Patient expresses understanding and agrees with plan as outlined above.  She has no further questions or concerns at this time.  Patient seen and evaluated with the sports medicine fellow.  I agree with the above plan of care.  Patient has made excellent progress with her home exercise program but she would like to learn a few more exercises.  Therefore, I will send her for some limited formal physical therapy.  I still think she needs to refrain from strength training for another 4 weeks or so.  As long as she continues to  improve she may follow-up with me as needed.

## 2020-09-12 ENCOUNTER — Ambulatory Visit: Payer: BC Managed Care – PPO | Attending: Sports Medicine | Admitting: Physical Therapy

## 2020-09-12 ENCOUNTER — Other Ambulatory Visit: Payer: Self-pay

## 2020-09-12 ENCOUNTER — Encounter: Payer: Self-pay | Admitting: Physical Therapy

## 2020-09-12 DIAGNOSIS — M6281 Muscle weakness (generalized): Secondary | ICD-10-CM | POA: Diagnosis not present

## 2020-09-12 DIAGNOSIS — M25612 Stiffness of left shoulder, not elsewhere classified: Secondary | ICD-10-CM | POA: Diagnosis not present

## 2020-09-12 DIAGNOSIS — M25512 Pain in left shoulder: Secondary | ICD-10-CM | POA: Insufficient documentation

## 2020-09-12 DIAGNOSIS — G8929 Other chronic pain: Secondary | ICD-10-CM | POA: Diagnosis not present

## 2020-09-12 NOTE — Therapy (Signed)
Katelyn Thomas, [place], [address] Phone: [phone]   Fax:  [phone]  Physical Therapy Evaluation  Patient Details  Name: Katelyn Thomas MRN: [id number] Date of Birth: [birth year] Referring Provider (PT): Adhesive capsulitis of left shoulder M75.02   Encounter Date: 09/12/2020   PT End of Session - 09/12/20 0803    Visit Number 1    Number of Visits 9    Date for PT Re-Evaluation 11/07/20    Authorization Type BCBS    PT Start Time 0801    PT Stop Time 0848    PT Time Calculation (min) 47 min    Activity Tolerance Patient tolerated treatment well    Behavior During Therapy [facility] for tasks assessed/performed           Past Medical History:  Diagnosis Date  . Allergy   . Asthma     Past Surgical History:  Procedure Laterality Date  . TONSILLECTOMY      There were no vitals filed for this visit.    Subjective Assessment - 09/12/20 0806    Subjective pt is a 65 y.o F with CC of L shoulder pain. She reports since COVID she hasn't been going to the gym and has been doing home exercises. she notes it has been months, She attributes this occuring when she was doing TRX pec fly's, she reported she backed off and let it calm down but it plateau. she reports pain stays mostly in the shoulder.    How long can you sit comfortably? unlimited    How long can you stand comfortably? unlimited    How long can you walk comfortably? unlimited    Diagnostic tests [place] at MD's    Patient Stated Goals to increase shoulder mobility, decrease stiffness/ pain,    Currently in Pain? Yes    Pain Score 0-No pain   reports mostly stiffness at 2-3   Pain Location Shoulder    Pain Orientation Left;Anterior    Pain Descriptors / Indicators Aching;Tightness    Pain Type Chronic pain    Pain Onset More than a month ago    Pain Frequency Constant    Aggravating Factors  getting up inthe morning, reaching behind the back      Pain Relieving Factors stretching, activity, frequent moving around.              [facility] PT Assessment - 09/12/20 0001      Assessment   Medical Diagnosis Katelyn Coyer, DO     Referring Provider (PT) Adhesive capsulitis of left shoulder M75.02    Onset Date/Surgical Date --   for months   Hand Dominance Right    Next MD Visit make one PRN    Prior Therapy yes      Precautions   Precautions None      Restrictions   Weight Bearing Restrictions No      Balance Screen   Has the patient fallen in the past 6 months No    Has the patient had a decrease in activity level because of a fear of falling?  No      Prior Function   Level of Independence Independent with basic ADLs    Vocation Retired      Observation/Other Assessments   Focus on Therapeutic Outcomes (FOTO)  33% limited    23% limited predicted     Posture/Postural Control   Posture/Postural Control Postural limitations    Postural Limitations Rounded  Shoulders    Posture Comments L1 scapualr dyskinesis R>L      ROM / Strength   AROM / PROM / Strength AROM;Strength;PROM      AROM   AROM Assessment Site Shoulder    Right/Left Shoulder Right;Left    Right Shoulder Extension 43 Degrees    Right Shoulder Flexion 158 Degrees    Right Shoulder ABduction 143 Degrees    Right Shoulder Internal Rotation --   T4   Right Shoulder External Rotation --   T4   Left Shoulder Extension 25 Degrees    Left Shoulder Flexion 140 Degrees    Left Shoulder ABduction 104 Degrees    Left Shoulder Internal Rotation --   T6 along the medial border of the shoulder blade   Left Shoulder External Rotation --   T4     PROM   PROM Assessment Site Shoulder    Right/Left Shoulder Left    Left Shoulder Flexion 150 Degrees    Left Shoulder ABduction 95 Degrees   guarding noted at end of available ROM     Strength   Strength Assessment Site Shoulder    Right/Left Shoulder Right;Left    Right Shoulder Flexion 4+/5    Right  Shoulder Extension 4+/5    Right Shoulder ABduction 4+/5    Right Shoulder Internal Rotation 4+/5    Right Shoulder External Rotation 4+/5    Left Shoulder Flexion 4-/5    Left Shoulder Extension 4/5    Left Shoulder ABduction 4-/5    Left Shoulder Internal Rotation 4/5    Left Shoulder External Rotation 4-/5      Palpation   Palpation comment TTP noted along long head of the bicep tendon, pec minor/ major and anteroir deltoid. multiple trigger points in upper trap/ levator scapulae      Special Tests    Special Tests Rotator Cuff Impingement    Rotator Cuff Impingment tests Michel Bickers test;Empty Can test;Full Can test;other;Painful Arc of Motion      Hawkins-Kennedy test   Findings Positive    Side Left      Empty Can test   Findings Not tested      Full Can test   Findings Not tested      Painful Arc of Motion   Findings Positive    Side Left      other   Findings Positive    Comments upward scapular rotation   decreased pain / improved ROM With flexion/ abduction                     Objective measurements completed on examination: See above findings.       [facility] Adult PT Treatment/Exercise - 09/12/20 0001      Exercises   Exercises Shoulder      Shoulder Exercises: Supine   Protraction Strengthening;Left;10 reps   maintained protraction with CW/CCW circles     Shoulder Exercises: Stretch   Corner Stretch 1 rep;30 seconds   doorway,   Corner Stretch Limitations modified to placing hands lower to hit clavicular fibers    Other Shoulder Stretches upper trap stretch 1 x 30 sec L                  PT Education - 09/12/20 0815    Education Details evaluation findings, POC, goals, HEP with proper form/ rationale.    Person(s) Educated Patient    Methods Explanation;Verbal cues;Handout    Comprehension Verbalized understanding;Verbal cues required  PT Short Term Goals - 09/12/20 1023      PT SHORT TERM GOAL #1   Title  pt to be IND with initialHEP    Time 4    Period Weeks    Status New    Target Date 10/10/20             PT Long Term Goals - 09/12/20 1036      PT LONG TERM GOAL #1   Title increase L shoulder flexion/ abduction to >/= 130 degrees for functional ROM required for lifting activities    Time 8    Period Weeks    Status New    Target Date 11/07/20      PT LONG TERM GOAL #2   Title pt to increase L shoulder IR/ER to WNL compared bil with no report of pain or limitations for personal hygiene and donning/ doffing clothing    Time 8    Period Weeks    Status New    Target Date 11/07/20      PT LONG TERM GOAL #3   Title increase L shoulder gross strength to >/= 4+/5 to promote funcitonal stability and promote scapulohumeral rhythm.    Time 8    Period Weeks    Status New    Target Date 11/07/20      PT LONG TERM GOAL #4   Title increase FOTO score to </= 24% limited to demo improvement in function    Time 8    Period Weeks    Status New    Target Date 11/07/20      PT LONG TERM GOAL #5   Title pt to be IND with all HEP to maintain and progress current LOF IND    Time 8    Period Weeks    Status New    Target Date 11/07/20                  Plan - 09/12/20 0956    Clinical Impression Statement pt presents to OPPT with CC of L shoulder stiffness with intermittent pain starting a few months ago with gradual onset. She demonstrates mild limitation with AROM in the L shoulder compared bil and associated weakness. TTP noted along long head of the bicep tendon, pec minor/ major and anteroir deltoid. she would benefit from physical therapy to decrase L shoulder pain/ stiffness, improve ROM/ strength and return to PLOF by addressing the deficits listed.    Stability/Clinical Decision Making Stable/Uncomplicated    Clinical Decision Making Low    Rehab Potential Good    PT Frequency 1x / week    PT Duration 8 weeks    PT Treatment/Interventions ADLs/Self Care Home  Management;Electrical Stimulation;Iontophoresis 4mg /ml Dexamethasone;Moist Heat;Ultrasound;Therapeutic activities;Therapeutic exercise;Balance training;Neuromuscular re-education;Manual techniques;Passive range of motion;Dry needling;Patient/family education;Taping    PT Next Visit Plan review/ update HEP, review/ provide FOTO handout. shoulder/ scapular mobs, shoulder strengthening, low load long duration stretch PRN, scapular stability    PT Home Exercise Plan pt had wall walks, pendulums and IR towel stretch from MD,  [id number]- ceiling punches supine, shoulder IR, ER, upper trap stretch, door way stretch (modified for upper pec stretch),    Consulted and Agree with Plan of Care Patient           Patient will benefit from skilled therapeutic intervention in order to improve the following deficits and impairments:  Improper body mechanics, Increased muscle spasms, Pain  Visit Diagnosis: Chronic left shoulder pain  Stiffness of left  shoulder, not elsewhere classified  Muscle weakness (generalized)     Problem List Patient Active Problem List   Diagnosis Date Noted  . Rosacea 12/12/2016  . Right wrist pain 03/24/2015  . 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 HIP PAIN, LEFT 03/05/2011  . MUSCLE WEAKNESS (GENERALIZED) 03/05/2011  . Pain in limb 11/16/2009  . ASTHMA, UNSPECIFIED 02/27/2007  . IRRITABLE BOWEL SYNDROME 02/27/2007   Starr Lake PT, DPT, LAT, ATC  09/12/20  10:44 AM      [facility] [address] [place], [place], [address] Phone: [phone]   Fax:  [phone]  Name: Katelyn Thomas MRN: [id number] Date of Birth: Feb 15, 1955

## 2020-09-26 ENCOUNTER — Other Ambulatory Visit: Payer: Self-pay

## 2020-09-26 ENCOUNTER — Ambulatory Visit: Payer: BC Managed Care – PPO | Admitting: Physical Therapy

## 2020-09-26 ENCOUNTER — Encounter: Payer: Self-pay | Admitting: Physical Therapy

## 2020-09-26 DIAGNOSIS — M25612 Stiffness of left shoulder, not elsewhere classified: Secondary | ICD-10-CM

## 2020-09-26 DIAGNOSIS — M6281 Muscle weakness (generalized): Secondary | ICD-10-CM

## 2020-09-26 DIAGNOSIS — M25512 Pain in left shoulder: Secondary | ICD-10-CM | POA: Diagnosis not present

## 2020-09-26 DIAGNOSIS — G8929 Other chronic pain: Secondary | ICD-10-CM | POA: Diagnosis not present

## 2020-09-26 NOTE — Therapy (Signed)
[place], [place], [address] Phone: [phone]   Fax:  [phone]  Physical Therapy Treatment  Patient Details  Name: Katelyn Thomas MRN: [id number] Date of Birth: 05/10/1955 Referring Provider (PT): Lilia Argue MD   Encounter Date: 09/26/2020   PT End of Session - 09/26/20 0846    Visit Number 2    Number of Visits 9    Date for PT Re-Evaluation 11/07/20    Authorization Type BCBS    PT Start Time 0846    PT Stop Time 0929    PT Time Calculation (min) 43 min    Activity Tolerance Patient tolerated treatment well    Behavior During Therapy [facility] for tasks assessed/performed           Past Medical History:  Diagnosis Date  . Allergy   . Asthma     Past Surgical History:  Procedure Laterality Date  . TONSILLECTOMY      There were no vitals filed for this visit.   Subjective Assessment - 09/26/20 0846    Subjective " Things are going better, I don't know ho wmuch more range I have more functional range."    Patient Stated Goals to increase shoulder mobility, decrease stiffness/ pain,    Currently in Pain? 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    Referring Provider (PT) Lilia Argue MD                         [facility] Adult PT Treatment/Exercise - 09/26/20 0001      Shoulder Exercises: Supine   Protraction Strengthening;Left   sustainted protratcition with 30 sec rhythmic stab   Protraction Weight (lbs) 4    Other Supine Exercises eccentric bicep curl x 6 seconds 2 x 10 with green theraband       Shoulder Exercises: Standing   Other Standing Exercises lower trap stretchening with elbows against the wall 2 x 10 with red theraband      Shoulder Exercises: Stretch   Other Shoulder Stretches 3 -way bicep stretch x 30 sec ea.      Manual Therapy   Manual Therapy Scapular mobilization;Joint mobilization    Manual therapy comments MTPR along the L upper trap/ levator scapulae, bicep brachii and pec minor    Joint Mobilization GHJ grade IV distraction/ inferior glide on L only    Scapular Mobilization grade IV scapular upward / downward rotation for LUE                  PT Education - 09/26/20 0924    Education Details reviewed HEP, FOTO assessment, and updated for bicep stretch    Person(s) Educated Patient    Methods Explanation;Verbal cues;Handout    Comprehension Verbalized understanding;Verbal cues required            PT Short Term Goals - 09/12/20 1023      PT SHORT TERM GOAL #1   Title  pt to be IND with initialHEP    Time 4    Period Weeks    Status New    Target Date 10/10/20             PT Long Term Goals - 09/12/20 1036      PT LONG TERM GOAL #1   Title increase L shoulder flexion/ abduction to >/= 130 degrees for functional ROM required for lifting activities    Time 8    Period Weeks    Status New    Target Date 11/07/20      PT LONG TERM GOAL #2   Title pt to increase L shoulder IR/ER to WNL compared bil with no report of pain or limitations for personal hygiene and donning/ doffing clothing    Time 8    Period Weeks    Status New    Target Date 11/07/20      PT LONG TERM GOAL #3   Title increase L shoulder gross strength to >/= 4+/5 to promote funcitonal stability and promote scapulohumeral rhythm.    Time 8    Period Weeks    Status New    Target Date 11/07/20      PT LONG TERM GOAL #4   Title increase FOTO score to </= 24% limited to demo improvement in function    Time 8    Period Weeks    Status New    Target Date 11/07/20        PT LONG TERM GOAL #5   Title pt to be IND with all HEP to maintain and progress current LOF IND    Time 8    Period Weeks    Status New    Target Date 11/07/20                 Plan - 09/26/20 0920    Clinical Impression Statement pt has been consistent with her HEP and reports improvement in functional ROM. focused on scapulohumeral rhythm to continued functional mobility with scapualr mobs and serratus/ lower trap strength. pt responded well to MTPR and stretching today. end of session she noted dereaed soreness with reaching behind her back and over head.    PT Treatment/Interventions ADLs/Self Care Home Management;Electrical Stimulation;Iontophoresis 4mg /ml Dexamethasone;Moist Heat;Ultrasound;Therapeutic activities;Therapeutic exercise;Balance training;Neuromuscular re-education;Manual techniques;Passive range of motion;Dry needling;Patient/family education;Taping    PT Next Visit Plan review/ update HEP, . shoulder/ scapular mobs, shoulder strengthening, low load long duration stretch PRN, scapular stability, if tolerated well give ceiling punches, lower trap stretching    PT Home Exercise Plan pt had wall walks, pendulums and IR towel stretch from MD,  [id number]- ceiling punches supine, shoulder IR, ER, upper trap stretch, door way stretch (modified for upper pec stretch), 3- way bicep stretch    Consulted and Agree with Plan of Care Patient           Patient will benefit from skilled therapeutic intervention in order to improve the following deficits and impairments:     Visit Diagnosis: Chronic left shoulder pain  Stiffness of left shoulder, not elsewhere classified  Muscle weakness (generalized)     Problem List Patient Active Problem List   Diagnosis Date Noted  . Rosacea 12/12/2016  . Right wrist pain 03/24/2015  . 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 HIP PAIN, LEFT 03/05/2011  . MUSCLE WEAKNESS  (GENERALIZED) 03/05/2011  . Pain in limb 11/16/2009  . ASTHMA, UNSPECIFIED 02/27/2007  . IRRITABLE  BOWEL SYNDROME 02/27/2007    Starr Lake PT, DPT, LAT, ATC  09/26/20  9:35 AM      [facility] [address] [place], [place], [address] Phone: [phone]   Fax:  [phone]  Name: Katelyn Thomas MRN: [id number] Date of Birth: April 25, 1955

## 2020-10-03 ENCOUNTER — Encounter: Payer: Self-pay | Admitting: Physical Therapy

## 2020-10-03 ENCOUNTER — Ambulatory Visit: Payer: BC Managed Care – PPO | Attending: Sports Medicine | Admitting: Physical Therapy

## 2020-10-03 ENCOUNTER — Other Ambulatory Visit: Payer: Self-pay

## 2020-10-03 DIAGNOSIS — M25612 Stiffness of left shoulder, not elsewhere classified: Secondary | ICD-10-CM | POA: Diagnosis not present

## 2020-10-03 DIAGNOSIS — M6281 Muscle weakness (generalized): Secondary | ICD-10-CM | POA: Diagnosis not present

## 2020-10-03 DIAGNOSIS — G8929 Other chronic pain: Secondary | ICD-10-CM

## 2020-10-03 DIAGNOSIS — M25512 Pain in left shoulder: Secondary | ICD-10-CM | POA: Insufficient documentation

## 2020-10-03 NOTE — Therapy (Addendum)
[facility] [address] [place], [place], [address] Phone: [phone]   Fax:  [phone]  Physical Therapy Treatment / Discharge  Patient Details  Name: Katelyn Thomas MRN: [id number] Date of Birth: 10/23/1955 Referring Provider (PT): Timothy Draper MD   Encounter Date: 10/03/2020   PT End of Session - 10/03/20 0834    Visit Number 3    Number of Visits 9    Date for PT Re-Evaluation 11/07/20    Authorization Type BCBS    PT Start Time 0830    PT Stop Time 0910    PT Time Calculation (min) 40 min    Activity Tolerance Patient tolerated treatment well    Behavior During Therapy WFL for tasks assessed/performed           Past Medical History:  Diagnosis Date  . Allergy   . Asthma     Past Surgical History:  Procedure Laterality Date  . TONSILLECTOMY      There were no vitals filed for this visit.   Subjective Assessment - 10/03/20 0832    Subjective Patient reports her shoulder is feeling good, she still has her "sore spots" but is feeling more functional.    Patient Stated Goals to increase shoulder mobility, decrease stiffness/ pain,    Currently in Pain? No/denies                             [facility] Adult PT Treatment/Exercise - 10/03/20 0001      Exercises   Exercises Shoulder      Shoulder Exercises: Standing   Protraction 10 reps    Protraction Limitations plank on table    Horizontal ABduction 15 reps    Theraband Level (Shoulder Horizontal ABduction) Level 1 (Yellow)    External Rotation 15 reps    Theraband Level (Shoulder External Rotation) Level 3 (Green)    Flexion 10 reps    Shoulder Flexion Weight (lbs) 3    Flexion Limitations scaption    Extension 15 reps    Theraband Level (Shoulder Extension) Level 3 (Green)    Other Standing Exercises Foward raise to 90 into horizontal abduction with 3# x 10    Other Standing Exercises Overhead press with elbows wide with 3# x  10      Shoulder Exercises: ROM/Strengthening   UBE (Upper Arm Bike) L1 x 4 min (2 fwd/bwd)      Shoulder Exercises: Stretch   Corner Stretch 30 seconds    Corner Stretch Limitations doorway elbow at 90 deg    Other Shoulder Stretches Sleeper stretch and cross body stretch at wall and sidelying x 30 sec    Other Shoulder Stretches 3-way bicep stretch at wall x 30 sec ea.                  PT Education - 10/03/20 0833    Education Details HEP update, shoulder strengthening and how to modify exercises    Person(s) Educated Patient    Methods Explanation;Handout;Demonstration;Verbal cues    Comprehension Verbalized understanding;Need further instruction;Returned demonstration;Verbal cues required            PT Short Term Goals - 09/12/20 1023      PT SHORT TERM GOAL #1   Title pt to be IND with initialHEP    Time 4    Period Weeks    Status New    Target Date 10/10/20               PT Long Term Goals - 09/12/20 1036      PT LONG TERM GOAL #1   Title increase L shoulder flexion/ abduction to >/= 130 degrees for functional ROM required for lifting activities    Time 8    Period Weeks    Status New    Target Date 11/07/20      PT LONG TERM GOAL #2   Title pt to increase L shoulder IR/ER to WNL compared bil with no report of pain or limitations for personal hygiene and donning/ doffing clothing    Time 8    Period Weeks    Status New    Target Date 11/07/20      PT LONG TERM GOAL #3   Title increase L shoulder gross strength to >/= 4+/5 to promote funcitonal stability and promote scapulohumeral rhythm.    Time 8    Period Weeks    Status New    Target Date 11/07/20      PT LONG TERM GOAL #4   Title increase FOTO score to </= 24% limited to demo improvement in function    Time 8    Period Weeks    Status New    Target Date 11/07/20      PT LONG TERM GOAL #5   Title pt to be IND with all HEP to maintain and progress current LOF IND    Time 8    Period  Weeks    Status New    Target Date 11/07/20                 Plan - 10/03/20 0836    Clinical Impression Statement Patient tolerated therapy well with no adverse effects. She demonstrates improving range of motion and with minimal discomfort so focused more of session and reviewing HEP and progressing strength. Patient was educated on beginning shoulder strengthening again at home and on how to modify exercises by using less weight or changing the range of motion of the movement. She would benefit from continued skilled PT to progress shoulder motion and strength in order to return to prior level of function.    PT Treatment/Interventions ADLs/Self Care Home Management;Electrical Stimulation;Iontophoresis 4mg/ml Dexamethasone;Moist Heat;Ultrasound;Therapeutic activities;Therapeutic exercise;Balance training;Neuromuscular re-education;Manual techniques;Passive range of motion;Dry needling;Patient/family education;Taping    PT Next Visit Plan review HEP and update PRN, shoulder/scapular mobs, shoulder strengthening, low load long duration stretch PRN, scapular stability, if tolerated well give ceiling punches, lower trap stretching    PT Home Exercise Plan [id number]- ceiling punches supine, shoulder IR / ER with band, upper trap stretch, door way stretch, 3- way bicep stretch, standing cross body stretch against wall ; patient insructed to begin her previous shoulder strengthening    Consulted and Agree with Plan of Care Patient           Patient will benefit from skilled therapeutic intervention in order to improve the following deficits and impairments:  Improper body mechanics, Increased muscle spasms, Pain  Visit Diagnosis: Chronic left shoulder pain  Stiffness of left shoulder, not elsewhere classified  Muscle weakness (generalized)     Problem List Patient Active Problem List   Diagnosis Date Noted  . Rosacea 12/12/2016  . Right wrist pain 03/24/2015  . Tensor fascia lata  syndrome 01/28/2014  . 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 HIP PAIN, LEFT 03/05/2011  . MUSCLE WEAKNESS (GENERALIZED) 03/05/2011  . Pain in limb 11/16/2009  . ASTHMA, UNSPECIFIED 02/27/2007  . IRRITABLE BOWEL SYNDROME 02/27/2007      Hilda Blades, PT, DPT, LAT, ATC 10/03/20  9:25 AM Phone: [phone] Fax: [facility] [address] [place], [place], [address] Phone: [phone]   Fax:  [phone]  Name: Katelyn Thomas MRN: [id number] Date of Birth: [birth year]       PHYSICAL THERAPY DISCHARGE SUMMARY  Visits from Start of Care: 3  Current functional level related to goals / functional outcomes: See goals   Remaining deficits: Current status unknown due to pt not returning   Education / Equipment: HEP, theraband, posture  Plan: Patient agrees to discharge.  Patient goals were not met. Patient is being discharged due to not returning since the last visit.  ?????         Kristoffer Leamon PT, DPT, LAT, ATC  10/31/20  5:45 PM

## 2020-10-10 ENCOUNTER — Ambulatory Visit: Payer: BC Managed Care – PPO | Admitting: Physical Therapy

## 2020-10-17 ENCOUNTER — Encounter: Payer: BC Managed Care – PPO | Admitting: Physical Therapy

## 2020-11-02 DIAGNOSIS — H40059 Ocular hypertension, unspecified eye: Secondary | ICD-10-CM | POA: Diagnosis not present

## 2020-11-17 DIAGNOSIS — M8580 Other specified disorders of bone density and structure, unspecified site: Secondary | ICD-10-CM | POA: Diagnosis not present

## 2020-11-17 DIAGNOSIS — Z23 Encounter for immunization: Secondary | ICD-10-CM | POA: Diagnosis not present

## 2020-11-17 DIAGNOSIS — J4520 Mild intermittent asthma, uncomplicated: Secondary | ICD-10-CM | POA: Diagnosis not present

## 2020-11-17 DIAGNOSIS — Z0000 Encounter for general adult medical examination without abnormal findings: Secondary | ICD-10-CM | POA: Diagnosis not present

## 2024-04-09 ENCOUNTER — Ambulatory Visit: Admitting: Sports Medicine

## 2024-04-09 VITALS — BP 125/67 | Ht 66.0 in | Wt 135.0 lb

## 2024-04-09 DIAGNOSIS — M25552 Pain in left hip: Secondary | ICD-10-CM | POA: Diagnosis not present

## 2024-04-09 DIAGNOSIS — M722 Plantar fascial fibromatosis: Secondary | ICD-10-CM | POA: Diagnosis not present

## 2024-04-09 MED ORDER — AMBULATORY NON FORMULARY MEDICATION: 0 refills | Status: AC

## 2024-04-09 NOTE — Progress Notes (Signed)
 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 Ivonne Andrew  Patient is a retired Air[employer] pilot.  She and her husband traveled extensively now.  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  At first she did require special cut out pads and used topical medications.  Later on a recurrence about 4 years ago working with Dr. Margaretha Sheffield she tried topical verapamil and had good resolution of her fibromas.  She now has return of 2 fibromas on her right foot and comes for treatment.  Left hip shows some limitation of motion when she does yoga poses.  She is quite active and not really having any significant pain with it right now.  She did have a painful episode when she probably stretched too far during an exercise class VI months ago.  She wants to know if good exercises might help prevent future issues.  Does have osteopenia and is taking calcium and vitamin D.  He asked about weighted vest which actually made her feel like she might of strained her hip a bit  Physical exam Pleasant older female in no acute distress BP 125/67   Ht 5\' 6"  (1.676 m)   Wt 135 lb (61.2 kg)   LMP 06/23/2013   BMI 21.79 kg/m   Left hip shows normal range of motion although about 5 degrees less than the right.  There is no limitation on internal rotation Strength testing of the hip abductors hip abductors and hip flexors is all normal. FADIR is nonpainful FABER is tighter than on the right hip but is nonpainful  Right foot shows 2 will describe fibromas on palpation along the medial aspect of the long arch 1 is about 40% distal to the heel and the other is about 80% distal to the heel just before the first MTP.  They are less than 5 mm

## 2024-04-09 NOTE — Assessment & Plan Note (Signed)
 Today to see if its effective we tried using ESWT  Head size 15 mm Power 60 m  Impulses 800 to the proximal nodule and 700 to the distal nodule Frequency 10  She tolerated the procedure well and will see if this offers benefit.  Since she had such a good result with topical verapamil 15% we will renew this prescription and she is to use that if she does not get resolution with the shockwave  Will return to see me as needed

## 2024-04-09 NOTE — Assessment & Plan Note (Signed)
 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  She has done well with injection in the past and we can always do this again if it flares significantly.  I do think she might benefit from keeping a regular series of hip abduction exercises which may help stabilize the joint better.  Her other exercise regimen and consistent use of yoga and Chi gong will help stabilize her hip as well.  We gave her the standard for exercises we use for hip abduction
